# Patient Record
Sex: Female | Born: 1960 | Race: Black or African American | Hispanic: No | Marital: Married | Smoking: Never smoker
Health system: Southern US, Community
[De-identification: ages and names within clinical notes are randomized; demographics above are authoritative.]

## PROBLEM LIST (undated history)

## (undated) DIAGNOSIS — E785 Hyperlipidemia, unspecified: Secondary | ICD-10-CM

## (undated) DIAGNOSIS — N189 Chronic kidney disease, unspecified: Secondary | ICD-10-CM

## (undated) DIAGNOSIS — I10 Essential (primary) hypertension: Secondary | ICD-10-CM

## (undated) DIAGNOSIS — F419 Anxiety disorder, unspecified: Secondary | ICD-10-CM

## (undated) DIAGNOSIS — R569 Unspecified convulsions: Secondary | ICD-10-CM

## (undated) HISTORY — PX: WRIST FRACTURE SURGERY: SHX121

## (undated) HISTORY — PX: ABDOMINAL HYSTERECTOMY: SHX81

---

## 2000-01-05 ENCOUNTER — Encounter: Payer: Self-pay | Admitting: Physical Medicine and Rehabilitation

## 2000-01-05 ENCOUNTER — Ambulatory Visit (HOSPITAL_COMMUNITY)
Admission: RE | Admit: 2000-01-05 | Discharge: 2000-01-05 | Payer: Self-pay | Admitting: Physical Medicine and Rehabilitation

## 2000-01-19 ENCOUNTER — Encounter: Payer: Self-pay | Admitting: Physical Medicine and Rehabilitation

## 2000-01-19 ENCOUNTER — Ambulatory Visit (HOSPITAL_COMMUNITY)
Admission: RE | Admit: 2000-01-19 | Discharge: 2000-01-19 | Payer: Self-pay | Admitting: Physical Medicine and Rehabilitation

## 2000-02-22 ENCOUNTER — Ambulatory Visit (HOSPITAL_BASED_OUTPATIENT_CLINIC_OR_DEPARTMENT_OTHER): Admission: RE | Admit: 2000-02-22 | Discharge: 2000-02-22 | Payer: Self-pay | Admitting: Orthopedic Surgery

## 2000-02-24 ENCOUNTER — Encounter: Payer: Self-pay | Admitting: Orthopedic Surgery

## 2000-02-24 ENCOUNTER — Ambulatory Visit (HOSPITAL_BASED_OUTPATIENT_CLINIC_OR_DEPARTMENT_OTHER): Admission: RE | Admit: 2000-02-24 | Discharge: 2000-02-24 | Payer: Self-pay | Admitting: Orthopedic Surgery

## 2000-05-17 ENCOUNTER — Ambulatory Visit (HOSPITAL_BASED_OUTPATIENT_CLINIC_OR_DEPARTMENT_OTHER): Admission: RE | Admit: 2000-05-17 | Discharge: 2000-05-17 | Payer: Self-pay | Admitting: Orthopedic Surgery

## 2001-01-30 ENCOUNTER — Ambulatory Visit (HOSPITAL_COMMUNITY): Admission: RE | Admit: 2001-01-30 | Discharge: 2001-01-30 | Payer: Self-pay | Admitting: Internal Medicine

## 2001-01-30 ENCOUNTER — Encounter: Payer: Self-pay | Admitting: Internal Medicine

## 2002-01-08 ENCOUNTER — Ambulatory Visit (HOSPITAL_COMMUNITY): Admission: RE | Admit: 2002-01-08 | Discharge: 2002-01-08 | Payer: Self-pay | Admitting: Family Medicine

## 2002-01-08 ENCOUNTER — Encounter: Payer: Self-pay | Admitting: Family Medicine

## 2003-01-23 ENCOUNTER — Ambulatory Visit (HOSPITAL_COMMUNITY): Admission: RE | Admit: 2003-01-23 | Discharge: 2003-01-23 | Payer: Self-pay | Admitting: Internal Medicine

## 2003-01-23 ENCOUNTER — Encounter: Payer: Self-pay | Admitting: Internal Medicine

## 2003-05-29 ENCOUNTER — Ambulatory Visit (HOSPITAL_COMMUNITY): Admission: RE | Admit: 2003-05-29 | Discharge: 2003-05-29 | Payer: Self-pay | Admitting: Family Medicine

## 2003-05-29 ENCOUNTER — Encounter: Payer: Self-pay | Admitting: Family Medicine

## 2003-08-10 ENCOUNTER — Emergency Department (HOSPITAL_COMMUNITY): Admission: EM | Admit: 2003-08-10 | Discharge: 2003-08-10 | Payer: Self-pay | Admitting: *Deleted

## 2003-08-11 ENCOUNTER — Ambulatory Visit (HOSPITAL_COMMUNITY): Admission: RE | Admit: 2003-08-11 | Discharge: 2003-08-11 | Payer: Self-pay | Admitting: Internal Medicine

## 2003-09-01 ENCOUNTER — Emergency Department (HOSPITAL_COMMUNITY): Admission: EM | Admit: 2003-09-01 | Discharge: 2003-09-01 | Payer: Self-pay | Admitting: Emergency Medicine

## 2005-03-02 ENCOUNTER — Emergency Department (HOSPITAL_COMMUNITY): Admission: EM | Admit: 2005-03-02 | Discharge: 2005-03-03 | Payer: Self-pay | Admitting: *Deleted

## 2005-12-12 ENCOUNTER — Ambulatory Visit (HOSPITAL_COMMUNITY): Admission: RE | Admit: 2005-12-12 | Discharge: 2005-12-12 | Payer: Self-pay | Admitting: Family Medicine

## 2006-07-20 ENCOUNTER — Ambulatory Visit (HOSPITAL_COMMUNITY): Admission: RE | Admit: 2006-07-20 | Discharge: 2006-07-20 | Payer: Self-pay | Admitting: Family Medicine

## 2006-10-08 ENCOUNTER — Emergency Department (HOSPITAL_COMMUNITY): Admission: EM | Admit: 2006-10-08 | Discharge: 2006-10-09 | Payer: Self-pay | Admitting: Emergency Medicine

## 2007-10-11 HISTORY — PX: COLONOSCOPY: SHX174

## 2007-10-11 HISTORY — PX: ESOPHAGOGASTRODUODENOSCOPY: SHX1529

## 2008-02-11 ENCOUNTER — Emergency Department (HOSPITAL_COMMUNITY): Admission: EM | Admit: 2008-02-11 | Discharge: 2008-02-11 | Payer: Self-pay | Admitting: Emergency Medicine

## 2008-02-12 ENCOUNTER — Ambulatory Visit (HOSPITAL_COMMUNITY): Admission: RE | Admit: 2008-02-12 | Discharge: 2008-02-12 | Payer: Self-pay | Admitting: Family Medicine

## 2008-02-15 ENCOUNTER — Ambulatory Visit (HOSPITAL_COMMUNITY): Admission: RE | Admit: 2008-02-15 | Discharge: 2008-02-15 | Payer: Self-pay | Admitting: Internal Medicine

## 2008-02-28 ENCOUNTER — Ambulatory Visit: Payer: Self-pay | Admitting: Internal Medicine

## 2008-03-07 ENCOUNTER — Ambulatory Visit (HOSPITAL_COMMUNITY): Admission: RE | Admit: 2008-03-07 | Discharge: 2008-03-07 | Payer: Self-pay | Admitting: Internal Medicine

## 2008-04-22 ENCOUNTER — Ambulatory Visit: Payer: Self-pay | Admitting: Internal Medicine

## 2008-04-22 ENCOUNTER — Ambulatory Visit (HOSPITAL_COMMUNITY): Admission: RE | Admit: 2008-04-22 | Discharge: 2008-04-22 | Payer: Self-pay | Admitting: Internal Medicine

## 2008-08-08 ENCOUNTER — Ambulatory Visit: Payer: Self-pay | Admitting: Internal Medicine

## 2008-09-11 ENCOUNTER — Ambulatory Visit: Payer: Self-pay | Admitting: Internal Medicine

## 2008-09-29 ENCOUNTER — Encounter (HOSPITAL_COMMUNITY): Admission: RE | Admit: 2008-09-29 | Discharge: 2008-10-07 | Payer: Self-pay | Admitting: Internal Medicine

## 2009-01-23 ENCOUNTER — Encounter (INDEPENDENT_AMBULATORY_CARE_PROVIDER_SITE_OTHER): Payer: Self-pay | Admitting: *Deleted

## 2011-02-22 NOTE — Consult Note (Signed)
Tammy Farrell, Tammy Farrell                ACCOUNT NO.:  [id number]   MEDICAL RECORD NO.:  [id number]          PATIENT TYPE:  AMB   LOCATION:  DAY                           FACILITY:  [facility]   PHYSICIAN:  R. Roetta Sessions, M.D. DATE OF BIRTH:  [birth year]   DATE OF CONSULTATION:  DATE OF DISCHARGE:                                 CONSULTATION   REQUESTING PHYSICIAN:  Patrica Duel, MD.   REASON FOR CONSULTATION:  Abdominal pain.   HISTORY OF PRESENT ILLNESS:  Ms. Hillmer is a 50 year old African American  female. A couple of weeks ago, she developed severe upper abdominal pain  around 11 o'clock in the evening.  She had had some bloating that day;  however, at 11:00 p.m., the pain became so severe in her epigastrium and  upper abdomen that she felt like she could not walk, she went to the  emergency room.  The pain was 10/10 on pain scale.  The pain lasted for  about 24 hours or so, severe, and then it did seem to taper off over the  next couple of days.  She was seen by Dr. Nobie Putnam, and an abdominal  ultrasound was ordered which showed multiple cysts in the liver, CBD  mildly dilated at 6.3-mm, and several nonmobile filling defects that  appeared to be adherent to the gallbladder wall, likely representing  polyps.  There was no wall thickening or pericholecystic fluid.  She  does have polycystic kidneys seen on ultrasound as well.  She had an  upper GI series at the same time, which showed moderate-grade GERD.  She  had lab work, which showed normal LFTs, normal lipase, a creatinine of  1.37, a glucose of 107, otherwise normal CMP, a normal CBC, and cardiac  markers which were negative.   She has had no nausea or vomiting since this episode, but during the  episode she did have severe nausea and dry heaves.  She rarely had  heartburn and indigestion prior to this episode, maybe once per month.  She denies any anorexia or early satiety.  She does feel as though food  gets stuck and points to  her xiphoid process.  Denies any odynophagia.  Her weight is steadily increasing.  She does have a bowel movement once  or twice per day.  Denies any rectal bleeding, melena, clay-colored  stools, or jaundice.  She was found to be H. Pylori. She did complete  her Prevpac treatment.  She was previously treated by Dr. Karilyn Cota back in  2000 as well.   PAST MEDICAL AND SURGICAL HISTORY:  1. Hypertension.  2. Hyperlipidemia.  3. Asthma.  4. Polycystic kidney disease , last creatinine 1.37.  5. Recent UTI.  6. Partial hysterectomy followed by bilateral salpingo-oophorectomy.   CURRENT MEDICATIONS:  1. Prevpac will be completed on Mar 02, 2008.  2. Macrobid 100 mg b.i.d.  3. Zocor 40 mg nightly.  4. Norvasc 10 mg daily.  5. Clonidine 0.1 mg t.i.d.  6. Advair Diskus p.r.n.  7. Albuterol inhaler p.r.n.   ALLERGIES:  No known drug allergies.   FAMILY HISTORY:  There is no known family history of colorectal  carcinoma, inflammatory bowel disease or chronic GI problems.   SOCIAL HISTORY:  Ms. Jenean Lindau is married.  She has two healthy children.  She is employed with the [employer] and works in  [employer].  She denies any tobacco, alcohol or drug use.   REVIEW OF SYSTEMS:  See HPI.  GU:  She had a recent urinary tract  infection, and is on Macrobid.  Denies any problems at this time.  Otherwise, negative review of systems. See HPI.   PHYSICAL EXAMINATION:  VITAL SIGNS:  Weight 246 pounds, height 6 feet 5-  inches, temperature 98 degrees, blood pressure 138/100, and pulse 80.  GENERAL:  Ms. Jared is a 50 year old female, who is alert, oriented,  pleasant, cooperative, and in no acute distress.  HEENT:  __________ clear, sclerae nonicteric. Conjunctivae pink.  Oropharynx is pink and moist without any lesions.  NECK:  Supple without any mass or thyromegaly.  CHEST:  Regular rate and rhythm.  Normal S1 and S2 without any murmurs,  clicks, rubs or gallops.  LUNGS:   Clear to auscultation bilaterally.  ABDOMEN:  Protuberant with positive bowel sounds x4.  No bruits  auscultated.  Soft and nondistended.  She does have mild tenderness to  the epigastrium and Murphy point tenderness.  There is no rebound  tenderness or guarding.  No hepatosplenomegaly or mass.  Exam is limited  given the patient's body habitus.  EXTREMITIES:  Without clubbing or edema bilaterally.   IMPRESSION:  Ms. Quito is a 50 year old African American female with  acute onset of severe epigastric and upper abdominal pain with nausea  and dry heaves, which led to an emergency room visit, and ultrasound and  upper GI series a couple of days later.  She does have a mildly dilated  common bile duct and gallbladder that appears to have polyp.  I would  question whether she has passed a gallstone despite the fact that her  LFTs were normal.  There was no evidence of pancreatitis.   She does have solid food dysphagia that appears to point to her distal  esophagus, which may require further evaluation.  However on upper GI  series, there is no evidence of webbing or stricture or mass noted.   Finally, she is due for average-risk screening colonoscopy given ACG  guidelines for African Americans.   PLAN:  1. She can complete Prevpac.  2. Start omeprazole 20 mg q.a.m. #31 with two refills.  3. I have discussed this case with Dr. Jena Gauss regarding her CBD      dilatation, and will pursue MRCP and MRI of the pancreas to further      evaluate her common bile duct and look for choledocholithiasis.  4. We will need to check her creatinine prior to MRCP.  If it is      elevated, the exam may need to be performed without a contrast.  5. Screening colonoscopy and EGD with possible esophageal dilatation      after MRCP, if MRCP is normal.  6. She is to follow up with Dr. Nobie Putnam regarding her hypertension.    Thank you Dr. Nobie Putnam for allowing [place] to participate in the care of Ms.   Carolin Coy.        Lorenza Burton, N.P.      Jonathon Bellows, M.D.  Electronically Signed    KJ/MEDQ  D:  02/28/2008  T:  02/29/2008  Job:  [id number]

## 2011-02-22 NOTE — Op Note (Signed)
Tammy Farrell, Tammy Farrell                ACCOUNT NO.:  [id number]   MEDICAL RECORD NO.:  [id number]          PATIENT TYPE:  AMB   LOCATION:  Tammy Farrell                           FACILITY:  [facility]   PHYSICIAN:  R. Roetta Sessions, M.D. DATE OF BIRTH:  1961/01/31   DATE OF PROCEDURE:  DATE OF DISCHARGE:                               OPERATIVE REPORT   Diagnostic EGD followed by screening colonoscopy.   INDICATIONS FOR PROCEDURE:  A 50 year old lady with acute onset  epigastric bilateral upper quadrant abdominal pain, nausea, and vomiting  necessitating ED visit.  Ultrasound upper GI series demonstrated bile  duct 6- to 7-mm questionable gallbladder polyps.  LFTs, amylase, and  lipase came back normal.  Common bile duct was 6-7 mm in diameter.  MRCP  demonstrated multiple cysts in the liver and kidney suggestive of  polycystic liver disease.  There was no evidence of stone disease on  MRI.  She was started on omeprazole 20 mg orally daily when we saw her  in the office recently.  This has been associated with a resolution of  the above-mentioned symptoms.  She has not had any abdominal pain or  reflux symptoms.  In fact, she was also complaining of dysphagia to  solids; however, those symptoms as well have resolved.  Upper GI series  suggested for gastroesophageal reflux disease.  EGD and colonoscopy are  now being done (latter done primarily for screening).  Risks, benefits,  alternatives, and limitations have been reviewed, questions answered.  She is agreeable.  Please see the documentation in medical record.   PROCEDURE NOTE:  O2 saturation, blood pressure, pulse, and respirations  monitored throughout the entire procedure.   CONSCIOUS SEDATION:  Versed 7 mg IV and Demerol 125 mg IV in divided  doses.   INSTRUMENT:  Pentax video chip system.   Cetacaine spray for topical pharyngeal anesthesia.   FINDINGS:  EGD examination of the tubular esophagus revealed normal-  appearing mucosa.   Esophageal lumen was widely patent through the EG  junction.   STOMACH:  Gastric cavity was emptied, insufflated well with air.  Thorough examination of the gastric mucosa including retroflexed view of  the proximal stomach esophagogastric junction demonstrated only a small  hiatal hernia.  Pylorus is patent, easily traversed.  Examination of the  bulb and second portion revealed no abnormalities.   THERAPEUTIC/DIAGNOSTIC MANEUVERS PERFORMED:  None.   The patient tolerated the procedure well and was prepared for  colonoscopy.  Digital rectal exam revealed no abnormalities.  Endoscopic  findings:  The prep was very poor.  Colon:  Colonic mucosa was surveyed  from the rectosigmoid junction through the left transverse, the right  colon, appendiceal orifice, ileocecal valve, and cecum.  The patient a  long capacious colon.  There was thick viscous liquid and semi formed  stool throughout the colon, which precluded complete examination of all  the colonic mucosa.  Because of the elongated redundant nature of the  colon, it was difficult to get a deep cecal intubation (this was  ultimately not feasible) from the level of the ileocecal valve and  the  cecum.  The scope was slowly withdrawn.  All previous mentioned mucosal  surfaces were again seen.  Because of the looping and redundancy,  changing the patient's position and external abdominal pressure were  required to maneuver the scope into the area of the ileocecal valve and  cecum; however, deep cecal intubation was not obtained.  The prep was  poor diffusely and as stated the colonic mucosa was not seen in its  entirety.  The scope was withdrawn, and all previously mentioned mucosal  segments were again seen.  Scope was pulled down the rectum.  A thorough  examination of the rectal mucosa including retroflexed view of the anal  verge demonstrated no abnormalities.  The patient tolerated both  procedures well, was reacted to endoscopy.    IMPRESSION:  EGD normal esophagus, small hiatal hernia, otherwise normal  stomach, D1-D2 colonoscopy findings, poor prep precluded complete  examination as described above, long redundant colon, and normal rectum.   The patient's polycystic kidney disease, elevated creatinine, and  elevated blood pressure today.   RECOMMENDATIONS:  1. Recommended Tammy Farrell follow up with Dr. Nobie Putnam regarding      management of chronic kidney disease and hypertension.  2. Her recent GI evaluation have resolved on omeprazole.  I would      continue to be suspicious that she did have a biliary etiology for      least some of her symptoms.  She has gallbladder polyps based on      ultrasound.  No evidence of choledocholithiasis on MRI/  Further      recommendations would include continue omeprazole 20 mg every Tammy Farrell.  3. We will arrange follow up appointment with [place] in 3 months.  4. At a minimum, I would suggest a repeat ultrasound besides potential      gallbladder polyps in 1 year as long as the patient does not have      any interim symptoms referable to the biliary tree, I would not      advocate cholecystectomy at this time.  5. Would bring her back for an early repeat screening colonoscopy in      the presence of a double prep (i.e. 5 years from there).      Jonathon Bellows, M.D.  Electronically Signed     RMR/MEDQ  D:  04/22/2008  T:  04/22/2008  Job:  [id number]   cc:   Patrica Duel, M.D.  Fax: [phone]

## 2011-02-22 NOTE — Assessment & Plan Note (Signed)
Tammy Farrell, BRITTLE                 CHART#:  [id number]   DATE:  09/11/2008                       DOB:  Mar 07, 1961   FOLLOWUP:  Band-like intermittent upper abdominal pain.   The patient returns after last being seen on August 08, 2008.  She has  had off and on band-like upper abdominal/epigastric pain, predominantly  postprandial intermittently for actually years.  We discussed the onset  of her symptoms.  She actually has had symptoms going back to around  2000 with Dr. Nadine Counts L. Ferguson and Dr. Karilyn Cota.  She does have gallbladder  polyp.  No stones.  She recently had an EGD and colonoscopy, which  failed to demonstrate the cause for symptoms.  She has failed to respond  to antacids and proton pump inhibitor therapy and closely reviewing her  symptoms.  She does describe a retroxiphoid pain, not like pain within  an hour after eating a meal.  She fasts.  She really does not have any  symptoms.  She does not have any melena, rectal bleeding, or otherwise  change in bowel habits.  She does have polycystic kidney disease and  noted to have a recent elevated creatinine.  Previously recommended she  follow up with Dr. Nobie Putnam and while on that and she has not yet  followed up with him because cause of a marginal prep on prior  colonoscopy.  It was recommended she come back in 5 years for a repeat  examination.  There was some question of biliary dilation on prior  ultrasound.  MRCP demonstrated again cyst in liver and kidney.  There is  no evidence of common duct stone disease or anything producing  mechanical obstruction.  Upper GI series demonstrated some suggestion of  gastroesophageal reflux disease, but again she has really had a poor  response to acid suppression/antacid therapy.  She has never had a HIDA  scan.   CURRENT MEDICATIONS:  See updated list.  She is taking Kapidex 60 mg  orally daily.   ALLERGIES:  No known drug allergies.   FAMILY HISTORY:  There is no family  history of GI malignancy including  gallbladder cancer.   PHYSICAL EXAMINATION:  GENERAL:  Today, she appears in no acute  distress.  VITAL SIGNS:  Weight 255, height 5 feet 5 inches, temperature 98.0, BP  130/100, and pulse 68.  SKIN:  Warm and dry.  CHEST:  Lungs are clear to auscultation.  CARDIAC:  Regular rate and rhythm without murmur, gallop, or rub.  ABDOMEN:  Obese, positive bowel sounds, soft and nontender without  appreciable mass or hepatosplenomegaly.   ASSESSMENT:  Longstanding intermittent waxing and waning, postprandial  epigastric pain, history of gallbladder polyp on ultrasound, borderline  dilated bile duct not mentioned above.  She has had normal LFTs.   At this point, I am fairly suspicious her gallbladder is the etiology of  her symptoms.   RECOMMENDATIONS:  We will proceed with a HIDA with fatty meal challenge  in the very near future.  She has evidence of chronic  cholecystitis/biliary dyskinesia and I told the patient, we would  arrange for her to see a surgeon to get her gallbladder out.  Again, I  have advised her given her elevated creatinine and polycystic kidney  disease, she ought to follow up with Dr. Nobie Putnam in  the near future.  Further recommendations to follow.       Jonathon Bellows, M.D.  Electronically Signed     RMR/MEDQ  D:  09/11/2008  T:  09/11/2008  Job:  [id number]

## 2011-02-22 NOTE — Assessment & Plan Note (Signed)
NAMEMarland Kitchen  ZOEE, HEENEY                 CHART#:  [id number]   DATE:  08/08/2008                       DOB:  [birth year]   Last seen on April 22, 2008, at which time she underwent EGD and a  screening colonoscopy.  She was found to have small hiatal hernia,  otherwise upper GI tract appeared unremarkable.  Poor prep screening  colonoscopy demonstrated no obvious abnormalities.  Recommended she come  back (with double prep) in 5 years because of the relatively poor prep.  She was having some upper abdominal pain which she relates to reflux.  She did noted that she did better with omeprazole, but has stopped  taking that agent and has had recurrent band-like upper abdominal pain,  nonassertive, but is associated with eating, may come and go over 1-2  hours, off and on may have more than one attack in a given day,  sometimes awaken from her sound sleep.  She has small gallbladder  polyps.  On ultrasound notes choledocholithiasis.  LFTs have been  completely normal.  She has slightly elevated creatinine at 1.40 and we  previously recommended that she see Dr. Nobie Putnam.  She has not yet  followed up with him.  No melena.  No hematochezia.  No early satiety.   CURRENT MEDICATIONS:  See updated list.   ALLERGIES:  No known drug allergies.   PHYSICAL EXAMINATION:  GENERAL:  She appears in no acute distress.  VITAL SIGNS:  Weight 254, height 5 feet and 5 inches, temperature 98, BP  130/90, and pulse 64.  SKIN:  Warm and dry.  LUNGS:  Clear to auscultation.  CARDIAC:  Regular rate and rhythm without murmur, gallop, or rub.  ABDOMEN:  Nondistended, obese, positive bowel sounds, soft, nontender.  No appreciable mass or organomegaly.   ASSESSMENT:  Band-like upper abdominal pain waxes and wanes, this may be  equivalent gastroesophageal reflux disease symptoms, atypical  gallbladder disease.  She does have gallbladder polyps; not mentioned  above, dosing with Rolaids definitely knocks down her pain, a  good 90%,  but only transiently.  I suspect this may in fact be somewhat atypical  gastroesophageal reflux disease symptoms.   RECOMMENDATIONS:  1. A 68-month course of Kapidex 60 mg orally daily.  We will see her      back in 1 month and assess her progress.  If she is not      dramatically better, we would proceed with a HIDA with fatty meal      challenge.  At a minimum, she will get another ultrasound in May or      June of next year to size of the gallbladder polyps once again.  2. Polycystic kidney disease.  Elevated creatinine, again she was      advised to follow up Dr. Nobie Putnam as I will defer management to      him.  3. Marginal prep recent screening colonoscopy has recommended early      followup screening in 5 years.       Jonathon Bellows, M.D.  Electronically Signed     RMR/MEDQ  D:  08/08/2008  T:  08/09/2008  Job:  [id number]   cc:   Patrica Duel, M.D.

## 2011-02-25 NOTE — Consult Note (Signed)
[place]  Patient:    Tammy Farrell                         MRN: [id number] Proc. Date: 02/24/00 Adm. Date:  [id number] Disc. Date: [id number] Attending:  Ronne Binning Dictator:   [id number] CC:         Dr. Cindee Salt             Dr. Audie Pinto, [place]             Dr. Mervyn Skeeters                          Consultation Report  CHIEF COMPLAINT: Seizure and headache.  HISTORY OF PRESENT ILLNESS: Tammy Farrell is a 50 year old right-handed married female who came in to the day surgery today for wrist arthroscopy.  While undergoing a regional anesthesia by axillary block, she became unconscious and had seizure activity.  She was intubated and stabilized, and shortly afterward was able to be extubated.  She has complained of a headache since that time localized to the right frontal region, which is throbbing in nature.  The headache has apparently improved over a period of observation but has not resolved completely.  The patient denies prior history of seizures.  She has had headaches in the past, usually related to anxiety, and underwent a CT scan of the brain for that in the past year or so, which was negative.  PAST MEDICAL HISTORY:  1. Diagnosis of polycystic kidney disease, but apparently she maintains good     renal function.  2. History of hypertension.  3. Status post hysterectomy.  CURRENT MEDICATIONS:  1. Plendil.  2. Ziac.  3. Lipitor.  4. Xanax or Ativan p.r.n.  FAMILY HISTORY: The patients father has a history of strokes and hypertension.  SOCIAL HISTORY: The patient is married and has two children.  PHYSICAL EXAMINATION:  GENERAL: This patient is a well-developed, obese, alert lady in mild distress from headache.  VITAL SIGNS: Blood pressure 142/90, pulse 81 and regular, respirations 14.  HEENT: Cranium normocephalic, atraumatic.  NECK: Supple without bruits.  HEART: Regular rate and rhythm without  murmurs.  LUNGS: Clear to auscultation.  EXTREMITIES: Without clubbing, cyanosis, or edema.  NEUROLOGIC: The patient is alert and oriented.  Her speech is normal.  Memory is intact.  Cranial nerve examination reveals full extraocular movements without nystagmus.  Pupils are equal and reactive to light, and visual fields are full to confrontation.  Funduscopic examination is unremarkable.  Facies symmetric.  The tongue protrudes in the midline.  Motor testing reveals negative RA, BA, RRE.  There is 5/5 strength throughout the upper and lower extremities.  Deep tendon reflexes are 1-2+ and symmetric, and plantar responses are downgoing bilaterally.  Sensory examination is intact to pinprick, temperature, and vibratory sensation except for decreased pinprick in the left arm, which is the site of her regional anesthesia.  Cerebellar testing reveals good rapid alternating movements and finger-to-nose.  Gait is normal based.  IMPRESSION:  1. Seizure, apparently secondary to systemic infiltration of local     anesthetic.  2. Headache, probably related to seizure and the medication she received     at the time of intubation, etc.  Examination reveals no focal neurologic     deficits.  RECOMMENDATIONS: I feel that she should undergo an outpatient EEG this coming week to rule out an underlying tendency  toward seizures.  I do not feel that she requires brain imaging study at this time, although certainly should her headache persist or if she developed other problems, I would have a little threshold to obtain a CT of the brain.  I appreciate the opportunity of evaluating this interesting patient. DD:  02/24/00 TD:  02/24/00 Job: [id number]

## 2011-07-13 ENCOUNTER — Other Ambulatory Visit (HOSPITAL_COMMUNITY): Payer: Self-pay | Admitting: Nephrology

## 2011-07-13 DIAGNOSIS — N289 Disorder of kidney and ureter, unspecified: Secondary | ICD-10-CM

## 2011-07-18 ENCOUNTER — Ambulatory Visit (HOSPITAL_COMMUNITY)
Admission: RE | Admit: 2011-07-18 | Discharge: 2011-07-18 | Disposition: A | Discharge status: Home / Self Care | Payer: BC Managed Care – PPO | Source: Ambulatory Visit | Attending: Nephrology | Admitting: Nephrology

## 2011-07-18 DIAGNOSIS — N289 Disorder of kidney and ureter, unspecified: Secondary | ICD-10-CM

## 2011-07-18 DIAGNOSIS — Q619 Cystic kidney disease, unspecified: Secondary | ICD-10-CM | POA: Insufficient documentation

## 2011-08-18 ENCOUNTER — Emergency Department (HOSPITAL_COMMUNITY): Payer: BC Managed Care – PPO

## 2011-08-18 ENCOUNTER — Observation Stay (HOSPITAL_COMMUNITY)
Admission: EM | Admit: 2011-08-18 | Discharge: 2011-08-20 | DRG: 219 | Disposition: A | Discharge status: Home Health | Payer: BC Managed Care – PPO | Attending: Orthopaedic Surgery | Admitting: Orthopaedic Surgery

## 2011-08-18 ENCOUNTER — Encounter (HOSPITAL_COMMUNITY)
Admission: EM | Disposition: A | Discharge status: Home Health | Payer: Self-pay | Source: Home / Self Care | Attending: Emergency Medicine

## 2011-08-18 ENCOUNTER — Emergency Department (HOSPITAL_COMMUNITY): Payer: BC Managed Care – PPO | Admitting: Anesthesiology

## 2011-08-18 ENCOUNTER — Encounter: Payer: Self-pay | Admitting: Emergency Medicine

## 2011-08-18 ENCOUNTER — Encounter (HOSPITAL_COMMUNITY): Payer: Self-pay | Admitting: Anesthesiology

## 2011-08-18 DIAGNOSIS — Z79899 Other long term (current) drug therapy: Secondary | ICD-10-CM

## 2011-08-18 DIAGNOSIS — I10 Essential (primary) hypertension: Secondary | ICD-10-CM | POA: Diagnosis present

## 2011-08-18 DIAGNOSIS — X500XXA Overexertion from strenuous movement or load, initial encounter: Secondary | ICD-10-CM | POA: Diagnosis present

## 2011-08-18 DIAGNOSIS — S82409A Unspecified fracture of shaft of unspecified fibula, initial encounter for closed fracture: Secondary | ICD-10-CM

## 2011-08-18 DIAGNOSIS — S9306XA Dislocation of unspecified ankle joint, initial encounter: Secondary | ICD-10-CM | POA: Diagnosis present

## 2011-08-18 DIAGNOSIS — S82843A Displaced bimalleolar fracture of unspecified lower leg, initial encounter for closed fracture: Principal | ICD-10-CM | POA: Diagnosis present

## 2011-08-18 DIAGNOSIS — S82209A Unspecified fracture of shaft of unspecified tibia, initial encounter for closed fracture: Secondary | ICD-10-CM

## 2011-08-18 DIAGNOSIS — W19XXXA Unspecified fall, initial encounter: Secondary | ICD-10-CM | POA: Diagnosis present

## 2011-08-18 HISTORY — PX: ORIF ANKLE FRACTURE: SHX5408

## 2011-08-18 HISTORY — DX: Essential (primary) hypertension: I10

## 2011-08-18 LAB — DIFFERENTIAL
Basophils Relative: 0 % (ref 0–1)
Eosinophils Absolute: 0.1 10*3/uL (ref 0.0–0.7)
Lymphs Abs: 3.3 10*3/uL (ref 0.7–4.0)
Monocytes Relative: 7 % (ref 3–12)
Neutro Abs: 4.5 10*3/uL (ref 1.7–7.7)
Neutrophils Relative %: 53 % (ref 43–77)

## 2011-08-18 LAB — BASIC METABOLIC PANEL
Chloride: 105 mEq/L (ref 96–112)
GFR calc Af Amer: 31 mL/min — ABNORMAL LOW (ref >90)
GFR calc non Af Amer: 27 mL/min — ABNORMAL LOW (ref >90)
Potassium: 3.9 mEq/L (ref 3.5–5.1)
Sodium: 140 mEq/L (ref 135–145)

## 2011-08-18 LAB — CBC
Hemoglobin: 14.3 g/dL (ref 12.0–15.0)
MCH: 28.4 pg (ref 26.0–34.0)
Platelets: 283 10*3/uL (ref 150–400)
RBC: 5.04 MIL/uL (ref 3.87–5.11)

## 2011-08-18 SURGERY — OPEN REDUCTION INTERNAL FIXATION (ORIF) ANKLE FRACTURE
Anesthesia: General | Site: Ankle | Laterality: Left | Wound class: Clean

## 2011-08-18 MED ORDER — DIPHENHYDRAMINE HCL 50 MG/ML IJ SOLN: 12.5000 mg | Freq: Four times a day (QID) | INTRAMUSCULAR | Status: DC | PRN

## 2011-08-18 MED ORDER — ALBUTEROL SULFATE (5 MG/ML) 0.5% IN NEBU
2.5000 mg | INHALATION_SOLUTION | Freq: Two times a day (BID) | RESPIRATORY_TRACT | Status: DC
  Administered 2011-08-19 – 2011-08-20 (×3): 2.5 mg via RESPIRATORY_TRACT
  Filled 2011-08-18 (×3): qty 0.5

## 2011-08-18 MED ORDER — METOPROLOL TARTRATE 50 MG PO TABS
50.0000 mg | ORAL_TABLET | Freq: Two times a day (BID) | ORAL | Status: DC
  Administered 2011-08-18 – 2011-08-20 (×4): 50 mg via ORAL
  Filled 2011-08-18 (×3): qty 1

## 2011-08-18 MED ORDER — METRONIDAZOLE IN NACL 5-0.79 MG/ML-% IV SOLN
INTRAVENOUS | Status: AC
  Filled 2011-08-18: qty 100

## 2011-08-18 MED ORDER — DIPHENHYDRAMINE HCL 12.5 MG/5ML PO ELIX: 12.5000 mg | ORAL_SOLUTION | Freq: Four times a day (QID) | ORAL | Status: DC | PRN

## 2011-08-18 MED ORDER — ZOLPIDEM TARTRATE 5 MG PO TABS: 10.0000 mg | ORAL_TABLET | Freq: Every evening | ORAL | Status: DC | PRN

## 2011-08-18 MED ORDER — FUROSEMIDE 20 MG PO TABS
20.0000 mg | ORAL_TABLET | Freq: Two times a day (BID) | ORAL | Status: DC
  Administered 2011-08-19 – 2011-08-20 (×3): 20 mg via ORAL
  Filled 2011-08-18 (×3): qty 1

## 2011-08-18 MED ORDER — MAGNESIUM HYDROXIDE 400 MG/5ML PO SUSP: 30.0000 mL | Freq: Every day | ORAL | Status: DC | PRN

## 2011-08-18 MED ORDER — CEFAZOLIN SODIUM 1-5 GM-% IV SOLN
INTRAVENOUS | Status: DC | PRN
  Administered 2011-08-18: 1 g via INTRAVENOUS

## 2011-08-18 MED ORDER — LORATADINE 10 MG PO TABS
10.0000 mg | ORAL_TABLET | Freq: Every day | ORAL | Status: DC
  Administered 2011-08-19 – 2011-08-20 (×2): 10 mg via ORAL
  Filled 2011-08-18 (×2): qty 1

## 2011-08-18 MED ORDER — PROPOFOL 10 MG/ML IV EMUL
INTRAVENOUS | Status: DC | PRN
  Administered 2011-08-18: 160 mg via INTRAVENOUS

## 2011-08-18 MED ORDER — ALBUTEROL SULFATE (5 MG/ML) 0.5% IN NEBU: 2.5000 mg | INHALATION_SOLUTION | Freq: Four times a day (QID) | RESPIRATORY_TRACT | Status: DC

## 2011-08-18 MED ORDER — CEFAZOLIN SODIUM 1-5 GM-% IV SOLN
1.0000 g | INTRAVENOUS | Status: DC
  Filled 2011-08-18: qty 50

## 2011-08-18 MED ORDER — ONDANSETRON HCL 4 MG/2ML IJ SOLN
INTRAMUSCULAR | Status: DC | PRN
  Administered 2011-08-18: 4 mg via INTRAVENOUS

## 2011-08-18 MED ORDER — LIDOCAINE HCL 1 % IJ SOLN
INTRAMUSCULAR | Status: DC | PRN
  Administered 2011-08-18: 30 mg via INTRADERMAL

## 2011-08-18 MED ORDER — MORPHINE SULFATE (PF) 1 MG/ML IV SOLN
INTRAVENOUS | Status: DC
  Administered 2011-08-18: 25 mg via INTRAVENOUS
  Administered 2011-08-19: 1.2 mg via INTRAVENOUS
  Administered 2011-08-19 – 2011-08-20 (×3): 3 mg via INTRAVENOUS
  Administered 2011-08-20: 4.5 mg via INTRAVENOUS
  Administered 2011-08-20: 1.5 mg via INTRAVENOUS
  Filled 2011-08-18: qty 25

## 2011-08-18 MED ORDER — ACETAMINOPHEN 325 MG PO TABS: 650.0000 mg | ORAL_TABLET | Freq: Four times a day (QID) | ORAL | Status: DC | PRN

## 2011-08-18 MED ORDER — ENOXAPARIN SODIUM 40 MG/0.4ML ~~LOC~~ SOLN
40.0000 mg | SUBCUTANEOUS | Status: DC
  Administered 2011-08-19 – 2011-08-20 (×2): 40 mg via SUBCUTANEOUS
  Filled 2011-08-18 (×2): qty 0.4

## 2011-08-18 MED ORDER — HYDROMORPHONE HCL PF 1 MG/ML IJ SOLN
1.0000 mg | Freq: Once | INTRAMUSCULAR | Status: AC
  Administered 2011-08-18: 1 mg via INTRAVENOUS
  Filled 2011-08-18: qty 1

## 2011-08-18 MED ORDER — AMLODIPINE BESYLATE 5 MG PO TABS
10.0000 mg | ORAL_TABLET | Freq: Every day | ORAL | Status: DC
  Administered 2011-08-19 – 2011-08-20 (×2): 10 mg via ORAL
  Filled 2011-08-18 (×2): qty 2

## 2011-08-18 MED ORDER — ONDANSETRON HCL 4 MG/2ML IJ SOLN
4.0000 mg | Freq: Once | INTRAMUSCULAR | Status: AC
  Administered 2011-08-18: 4 mg via INTRAVENOUS
  Filled 2011-08-18: qty 2

## 2011-08-18 MED ORDER — CEFAZOLIN SODIUM 1-5 GM-% IV SOLN
INTRAVENOUS | Status: AC
  Filled 2011-08-18: qty 100

## 2011-08-18 MED ORDER — ALBUTEROL SULFATE (5 MG/ML) 0.5% IN NEBU: 2.5000 mg | INHALATION_SOLUTION | RESPIRATORY_TRACT | Status: DC | PRN

## 2011-08-18 MED ORDER — SIMVASTATIN 10 MG PO TABS
10.0000 mg | ORAL_TABLET | Freq: Every day | ORAL | Status: DC
  Administered 2011-08-19: 10 mg via ORAL
  Filled 2011-08-18: qty 1

## 2011-08-18 MED ORDER — ROCURONIUM BROMIDE 100 MG/10ML IV SOLN
INTRAVENOUS | Status: DC | PRN
  Administered 2011-08-18: 35 mg via INTRAVENOUS

## 2011-08-18 MED ORDER — SODIUM CHLORIDE 0.9 % IV SOLN
INTRAVENOUS | Status: DC
  Administered 2011-08-18 – 2011-08-20 (×4): via INTRAVENOUS

## 2011-08-18 MED ORDER — LACTATED RINGERS IV SOLN
INTRAVENOUS | Status: DC | PRN
  Administered 2011-08-18: 20:00:00 via INTRAVENOUS

## 2011-08-18 MED ORDER — ONDANSETRON HCL 4 MG/2ML IJ SOLN: 4.0000 mg | Freq: Four times a day (QID) | INTRAMUSCULAR | Status: DC | PRN

## 2011-08-18 MED ORDER — FENTANYL CITRATE 0.05 MG/ML IJ SOLN
INTRAMUSCULAR | Status: DC | PRN
  Administered 2011-08-18: 50 ug via INTRAVENOUS
  Administered 2011-08-18: 100 ug via INTRAVENOUS
  Administered 2011-08-18 (×4): 50 ug via INTRAVENOUS
  Administered 2011-08-18: 100 ug via INTRAVENOUS

## 2011-08-18 MED ORDER — CEFAZOLIN SODIUM 1-5 GM-% IV SOLN
1.0000 g | Freq: Three times a day (TID) | INTRAVENOUS | Status: AC
  Administered 2011-08-19 (×2): 1 g via INTRAVENOUS
  Filled 2011-08-18 (×2): qty 50

## 2011-08-18 MED ORDER — SUCCINYLCHOLINE CHLORIDE 20 MG/ML IJ SOLN
INTRAMUSCULAR | Status: DC | PRN
  Administered 2011-08-18: 180 mg via INTRAVENOUS

## 2011-08-18 MED ORDER — SODIUM CHLORIDE 0.9 % IR SOLN
Status: DC | PRN
  Administered 2011-08-18: 1000 mL

## 2011-08-18 MED ORDER — SODIUM CHLORIDE 0.9 % IJ SOLN: 9.0000 mL | INTRAMUSCULAR | Status: DC | PRN

## 2011-08-18 MED ORDER — NALOXONE HCL 0.4 MG/ML IJ SOLN: 0.4000 mg | INTRAMUSCULAR | Status: DC | PRN

## 2011-08-18 MED ORDER — PROMETHAZINE HCL 25 MG/ML IJ SOLN: 12.5000 mg | INTRAMUSCULAR | Status: DC | PRN

## 2011-08-18 MED ORDER — MIDAZOLAM HCL 5 MG/5ML IJ SOLN
INTRAMUSCULAR | Status: DC | PRN
  Administered 2011-08-18: 2 mg via INTRAVENOUS

## 2011-08-18 SURGICAL SUPPLY — 60 items
BAG HAMPER (MISCELLANEOUS) ×2 IMPLANT
BANDAGE ELASTIC 4 VELCRO NS (GAUZE/BANDAGES/DRESSINGS) ×4 IMPLANT
BANDAGE ELASTIC 6 VELCRO NS (GAUZE/BANDAGES/DRESSINGS) ×6 IMPLANT
BANDAGE ESMARK 4X12 BL STRL LF (DISPOSABLE) ×1 IMPLANT
BIT DRILL 2.5X110 QC LCP DISP (BIT) ×2 IMPLANT
BIT DRILL 2.8 (BIT) ×1
BIT DRILL CANN QC 2.8X165 (BIT) ×1 IMPLANT
BIT DRILL JC END 3.2X130 (BIT) ×2 IMPLANT
BLADE SURG 15 STRL LF DISP TIS (BLADE) ×1 IMPLANT
BLADE SURG 15 STRL SS (BLADE) ×1
BNDG ESMARK 4X12 BLUE STRL LF (DISPOSABLE) ×2
CLOTH BEACON ORANGE TIMEOUT ST (SAFETY) ×2 IMPLANT
COVER LIGHT HANDLE STERIS (MISCELLANEOUS) ×4 IMPLANT
COVER MAYO STAND XLG (DRAPE) ×2 IMPLANT
CUFF TOURNIQUET SINGLE 34IN LL (TOURNIQUET CUFF) IMPLANT
CUFF TOURNIQUET SINGLE 44IN (TOURNIQUET CUFF) ×2 IMPLANT
DRAPE C-ARM FOLDED MOBILE STRL (DRAPES) ×4 IMPLANT
DRILL BIT 2.8MM (BIT) ×1
GAUZE XEROFORM 5X9 LF (GAUZE/BANDAGES/DRESSINGS) ×2 IMPLANT
GLOVE BIO SURGEON STRL SZ8 (GLOVE) ×2 IMPLANT
GLOVE BIO SURGEON STRL SZ8.5 (GLOVE) ×2 IMPLANT
GLOVE ECLIPSE 6.5 STRL STRAW (GLOVE) ×4 IMPLANT
GLOVE EXAM NITRILE MD LF STRL (GLOVE) ×4 IMPLANT
GLOVE INDICATOR 7.0 STRL GRN (GLOVE) ×4 IMPLANT
GOWN BRE IMP SLV AUR XL STRL (GOWN DISPOSABLE) ×4 IMPLANT
GOWN STRL REIN XL XLG (GOWN DISPOSABLE) ×2 IMPLANT
INST SET MINOR BONE (KITS) ×2 IMPLANT
K-WIRE 229MX1.6 (WIRE) IMPLANT
K-WIRE 4.0X.028 (WIRE) ×2 IMPLANT
K-WIRE DBL PT .062 (WIRE) ×2 IMPLANT
KIT ROOM TURNOVER APOR (KITS) ×2 IMPLANT
MANIFOLD NEPTUNE II (INSTRUMENTS) ×2 IMPLANT
NS IRRIG 1000ML POUR BTL (IV SOLUTION) ×2 IMPLANT
PACK BASIC LIMB (CUSTOM PROCEDURE TRAY) ×2 IMPLANT
PAD ABD 5X9 TENDERSORB (GAUZE/BANDAGES/DRESSINGS) ×6 IMPLANT
PAD ARMBOARD 7.5X6 YLW CONV (MISCELLANEOUS) ×2 IMPLANT
PAD CAST 4YDX4 CTTN HI CHSV (CAST SUPPLIES) ×1 IMPLANT
PADDING CAST COTTON 4X4 STRL (CAST SUPPLIES) ×1
PLATE LCP 3.5 1/3 TUB 8HX93 (Plate) ×2 IMPLANT
SCREW CORTEX 3.5 14MM (Screw) ×3 IMPLANT
SCREW CORTEX 3.5 16MM (Screw) ×2 IMPLANT
SCREW CORTEX 3.5 18MM (Screw) ×1 IMPLANT
SCREW CORTEX 3.5 38MM (Screw) ×1 IMPLANT
SCREW LOCK CORT ST 3.5X14 (Screw) ×3 IMPLANT
SCREW LOCK CORT ST 3.5X16 (Screw) ×2 IMPLANT
SCREW LOCK CORT ST 3.5X18 (Screw) ×1 IMPLANT
SCREW LOCK CORT ST 3.5X38 (Screw) ×1 IMPLANT
SCREW LOCKING 3.5 (Screw) ×2 IMPLANT
SCREW PROS MALLEO 55 26MM (Screw) ×2 IMPLANT
SET BASIN LINEN APH (SET/KITS/TRAYS/PACK) ×2 IMPLANT
SOL PREP PROV IODINE SCRUB 4OZ (MISCELLANEOUS) ×2 IMPLANT
SPLINT J 5 (CAST SUPPLIES) ×2 IMPLANT
SPONGE GAUZE 4X4 12PLY (GAUZE/BANDAGES/DRESSINGS) ×2 IMPLANT
SPONGE LAP 18X18 X RAY DECT (DISPOSABLE) ×2 IMPLANT
SPONGE LAP 4X18 X RAY DECT (DISPOSABLE) IMPLANT
STAPLER VISISTAT 35W (STAPLE) ×2 IMPLANT
SUT CHROMIC 2 0 CT 1 (SUTURE) ×4 IMPLANT
SUT NUROLON CT 2 BLK #1 18IN (SUTURE) IMPLANT
SUT PLAIN 2 0 XLH (SUTURE) ×4 IMPLANT
TOWEL OR 17X26 4PK STRL BLUE (TOWEL DISPOSABLE) ×2 IMPLANT

## 2011-08-18 NOTE — Anesthesia Procedure Notes (Addendum)
Procedure Name: Intubation Date/Time: 08/18/2011 7:09 PM Performed by: Despina Hidden Pre-anesthesia Checklist: Emergency Drugs available, Suction available, Timeout performed, Patient identified and Patient being monitored Patient Re-evaluated:Patient Re-evaluated prior to inductionOxygen Delivery Method: Circle System Utilized Preoxygenation: Pre-oxygenation with 100% oxygen Intubation Type: IV induction, Rapid sequence and Circoid Pressure applied Ventilation: Mask ventilation without difficulty Laryngoscope Size: 3 and Mac Grade View: Grade II Tube type: Oral Tube size: 7.0 mm Number of attempts: 1 Airway Equipment and Method: stylet Placement Confirmation: ETT inserted through vocal cords under direct vision,  positive ETCO2 and breath sounds checked- equal and bilateral Secured at: 22 cm Tube secured with: Tape Dental Injury: Teeth and Oropharynx as per pre-operative assessment

## 2011-08-18 NOTE — Addendum Note (Signed)
Addendum  created 08/18/11 2116 by Despina Hidden   Modules edited:Anesthesia Medication Administration

## 2011-08-18 NOTE — Anesthesia Postprocedure Evaluation (Signed)
  Anesthesia Post-op Note  Patient: Tammy Farrell  Procedure(s) Performed:  OPEN REDUCTION INTERNAL FIXATION (ORIF) ANKLE FRACTURE  Patient Location: PACU  Anesthesia Type: General  Level of Consciousness: awake, alert , oriented and patient cooperative  Airway and Oxygen Therapy: Patient Spontanous Breathing  Post-op Pain: 6 /10, moderate  Post-op Assessment: Post-op Vital signs reviewed, Patient's Cardiovascular Status Stable, Respiratory Function Stable, Patent Airway and No signs of Nausea or vomiting  Post-op Vital Signs: Reviewed and stable  Complications: No apparent anesthesia complications

## 2011-08-18 NOTE — Anesthesia Preprocedure Evaluation (Signed)
Anesthesia Evaluation   Patient awake    Reviewed: Allergy & Precautions, H&P , NPO status , Patient's Chart, lab work & pertinent test results, reviewed documented beta blocker date and time   Airway       Dental   Pulmonary asthma ,          Cardiovascular Exercise Tolerance: Good hypertension, Pt. on home beta blockers     Neuro/Psych    GI/Hepatic   Endo/Other    Renal/GU      Musculoskeletal   Abdominal   Peds  Hematology   Anesthesia Other Findings   Reproductive/Obstetrics                           Anesthesia Physical Anesthesia Plan  ASA: II  Anesthesia Plan: General   Post-op Pain Management:    Induction: Intravenous, Cricoid pressure planned and Rapid sequence  Airway Management Planned: Oral ETT  Additional Equipment:   Intra-op Plan:   Post-operative Plan: Post-operative intubation/ventilation  Informed Consent:   Plan Discussed with: Anesthesiologist  Anesthesia Plan Comments:         Anesthesia Quick Evaluation

## 2011-08-18 NOTE — H&P (Signed)
Tammy Farrell is an 50 y.o. female.   Chief Complaint: I hurt my left ankle HPI: She twisted her left ankle and fell at the same time at her home near her oil drum outside this afternoon.  She heard a pop and felt severe pain of the left ankle.  She has no other injury.  Xrays show a displaced dislocation of the left ankle medial and posteriorly with fracture of the medial malleolus and lateral malleolus with disruption of the syndesmosis of the ankle.  She last had something to drink at 11:30 am and same for something to eat.  Past Medical History  Diagnosis Date  . Hypertension     Past Surgical History  Procedure Date  . Abdominal hysterectomy   . Wrist fracture surgery left    History reviewed. No pertinent family history. Social History:  reports that she has quit smoking. She does not have any smokeless tobacco history on file. She reports that she does not drink alcohol or use illicit drugs.  Allergies: No Known Allergies  Medications Prior to Admission  Medication Dose Route Frequency Provider Last Rate Last Dose  . 0.9 %  sodium chloride infusion   Intravenous Continuous [name], MD 125 mL/hr at 08/18/11 1640    . HYDROmorphone (DILAUDID) injection 1 mg  1 mg Intravenous Once [name], MD   1 mg at 08/18/11 1639  . ondansetron (ZOFRAN) injection 4 mg  4 mg Intravenous Once [name], MD   4 mg at 08/18/11 1639   No current outpatient prescriptions on file as of 08/18/2011.    No results found for this or any previous visit (from the past 48 hour(s)). Dg Tibia/fibula Left  08/18/2011  *RADIOLOGY REPORT*  Clinical Data: Fall  LEFT TIBIA AND FIBULA - 2 VIEW  Comparison: None.  Findings: The proximal tibia and fibula are intact.  The distal fibula fracture and distal ankle dislocation are noted.  IMPRESSION: No evidence of bony injury in the mid or proximal tibia and fibula. Ankle injury is noted.  Original Report Authenticated By: Donavan Burnet, M.D.   Dg  Ankle Complete Left  08/18/2011  *RADIOLOGY REPORT*  Clinical Data: Larey Seat.  Injured left ankle.  LEFT ANKLE COMPLETE - 3+ VIEW  Comparison: None  Findings: There is a fracture dislocation involving the ankle. There is a posterior tibiotalar dislocation with an associated transverse fracture through the medial malleolus and a displaced distal fibular shaft fracture.  The talus is intact.  The subtalar joints are maintained.  IMPRESSION: Ankle fracture/dislocation.  Original Report Authenticated By: P. Loralie Champagne, M.D.   Dg Foot Complete Left  08/18/2011  *RADIOLOGY REPORT*  Clinical Data: Fall, pain  LEFT FOOT - COMPLETE 3+ VIEW  Comparison: None.  Findings: No foot fracture is seen. There is a severe ankle fracture with dislocation of the tibia anterior on the talus, incompletely evaluated on this foot series.  Please see report of the tibia.  IMPRESSION: As above.  Original Report Authenticated By: Elsie Stain, M.D.    Review of Systems  Constitutional: Negative.   HENT: Negative.   Eyes: Negative.   Respiratory: Negative.   Cardiovascular:       History of hypertension.  Gastrointestinal: Negative.   Genitourinary: Negative.   Musculoskeletal: Positive for joint pain (left ankle today) and falls (today).  Skin: Negative.   Neurological: Negative.   Endo/Heme/Allergies: Negative.   Psychiatric/Behavioral: Negative.     Blood pressure 141/78, pulse 62,  temperature 98 F (36.7 C), temperature source Oral, resp. rate 18, SpO2 99.00%. Physical Exam  Constitutional: She is oriented to person, place, and time. She appears well-developed and well-nourished.  HENT:  Head: Normocephalic and atraumatic.  Eyes: Conjunctivae and EOM are normal. Pupils are equal, round, and reactive to light.  Neck: Normal range of motion. Neck supple.  Cardiovascular: Normal rate, regular rhythm, normal heart sounds and intact distal pulses.   Respiratory: Effort normal and breath sounds normal.  GI:  Soft. Bowel sounds are normal.  Musculoskeletal: She exhibits tenderness (left ankle).       Left ankle: She exhibits decreased range of motion, swelling and deformity. tenderness. Lateral malleolus and medial malleolus tenderness found.       Feet:  Neurological: She is alert and oriented to person, place, and time. She has normal reflexes.  Skin: Skin is warm and dry.  Psychiatric: She has a normal mood and affect. Her behavior is normal. Judgment and thought content normal.     Assessment/Plan Fracture dislocation of the left ankle.  Plan to take to operating room shortly for surgery of the ankle.  I have discussed the significant findings with the patient and the risks and imponderables of the procedure including infection, embolism leading to death, need for post operative physical therapy, need to have a further surgery in about 6 to 8 weeks to remove the syndesmosis screw, and anesthesia risks among others.  She appears to understand and agrees to the procedure.  She asked appropriate questions.  Her daughter was present.    Jeena Arnett 08/18/2011, 5:27 PM

## 2011-08-18 NOTE — ED Notes (Signed)
Pt leaving for surgery at this time with OR team

## 2011-08-18 NOTE — Brief Op Note (Signed)
08/18/2011  8:31 PM  PATIENT:  Tammy Farrell  50 y.o. female  PRE-OPERATIVE DIAGNOSIS:  left ankle fracture/dislocation  POST-OPERATIVE DIAGNOSIS:  left ankle fracture/dislocation  PROCEDURE:  Procedure(s): Closed reduction of left ankle fracture under anesthesia, then OPEN REDUCTION INTERNAL FIXATION (ORIF) ANKLE FRACTURE  SURGEON:  Surgeon(s): [employer]  PHYSICIAN ASSISTANT:   ASSISTANTS: none   ANESTHESIA:   general  EBL:  Total I/O In: 700 [I.V.:700] Out: 625 [Urine:625]  BLOOD ADMINISTERED:none  DRAINS: none   LOCAL MEDICATIONS USED:  NONE  SPECIMEN:  No Specimen  DISPOSITION OF SPECIMEN:  N/A  COUNTS:  YES  TOURNIQUET:   Total Tourniquet Time Documented: Thigh (laterality) - 46 minutes  DICTATION: .Other Dictation: Dictation Number [id number]  PLAN OF CARE: Admit to inpatient   PATIENT DISPOSITION:  PACU - hemodynamically stable.   Delay start of Pharmacological VTE agent (>24hrs) due to surgical blood loss or risk of bleeding:no

## 2011-08-18 NOTE — ED Notes (Signed)
Pt tripped and fell. Pt has obvious deformity to L anterior/lateral ankle.pedal pulses strong. Denies hitting head or loc.

## 2011-08-18 NOTE — ED Provider Notes (Addendum)
Scribed for Tammy Melter, MD, the patient was seen in room APA16A/APA16A . This chart was scribed by Ellie Lunch.   CSN: [id number] Arrival date & time: 08/18/2011  3:41 PM   First MD Initiated Contact with Patient 08/18/11 1556      Chief Complaint  Patient presents with  . Ankle Injury    (Consider location/radiation/quality/duration/timing/severity/associated sxs/prior treatment) HPI PT seen at 3:58 PM Tammy Farrell is a 50 y.o. female who presents to the Emergency Department complaining of left ankle pain. Pt tripped in yard on uneven ground today. Pt was unable to get up due to pain in left ankle. Pain is described as constant and rated 10/10 in severity.  Nothing improves pain. Pain is worse on palpation. Pt denies any other pain or injury from fall. Denies pain to neck, back, chest, abdomen. No pain to right leg. No pain to left knee. There are no other associated symptoms and no other alleviating or aggravating factors.   Orthopedists previously seen Dr. Hilda Lias and Dr. Merlyn Lot.   Past Medical History  Diagnosis Date  . Hypertension     Past Surgical History  Procedure Date  . Abdominal hysterectomy   . Wrist fracture surgery left    History reviewed. No pertinent family history.  History  Substance Use Topics  . Smoking status: Former Games developer  . Smokeless tobacco: Not on file  . Alcohol Use: No    Review of Systems  HENT: Negative for neck pain.   Cardiovascular: Negative for chest pain.  Gastrointestinal: Negative for abdominal pain.  Musculoskeletal: Negative for back pain.       Left ankle pain  Neurological: Negative for syncope.  All other systems reviewed and are negative.   Allergies  Review of patient's allergies indicates no known allergies.  Home Medications   Current Outpatient Rx  Name Route Sig Dispense Refill  . AMLODIPINE BESYLATE 10 MG PO TABS Oral Take 10 mg by mouth daily.      Marland Kitchen CETIRIZINE HCL 10 MG PO TABS Oral Take 10 mg by  mouth daily.      . FUROSEMIDE 20 MG PO TABS Oral Take 20 mg by mouth 2 (two) times daily.      Marland Kitchen METOPROLOL TARTRATE 50 MG PO TABS Oral Take 50 mg by mouth 2 (two) times daily.      Marland Kitchen PRAVASTATIN SODIUM 20 MG PO TABS Oral Take 20 mg by mouth daily.      Marland Kitchen ZOLPIDEM TARTRATE 10 MG PO TABS Oral Take 10 mg by mouth at bedtime as needed. For sleep       BP 141/78  Pulse 62  Temp(Src) 98 F (36.7 C) (Oral)  Resp 18  SpO2 99%  Physical Exam  Nursing note and vitals reviewed. Constitutional: She is oriented to person, place, and time. She appears well-developed and well-nourished.  HENT:  Head: Normocephalic and atraumatic.  Eyes: Conjunctivae are normal. No scleral icterus.  Neck: Normal range of motion. Neck supple.  Pulmonary/Chest: Effort normal. No respiratory distress.  Musculoskeletal:       Able to wiggle left toes.  Good dorsalis pedis pulse. Left foot.    Deformity left mid foot.  Tenderness left ankle, medial midfoot, posterior heel, distal tibia.   Neurological: She is alert and oriented to person, place, and time.  Skin: Skin is warm and dry.  Psychiatric: She has a normal mood and affect. Thought content normal.    ED Course  Procedures (including critical care time)  OTHER DATA REVIEWED: Nursing notes, vital signs, and past medical records reviewed.   DIAGNOSTIC STUDIES: Oxygen Saturation is 99% on room air, normal by my interpretation.     Dg Tibia/fibula Left  08/18/2011  *RADIOLOGY REPORT*  Clinical Data: Fall  LEFT TIBIA AND FIBULA - 2 VIEW  Comparison: None.  Findings: The proximal tibia and fibula are intact.  The distal fibula fracture and distal ankle dislocation are noted.  IMPRESSION: No evidence of bony injury in the mid or proximal tibia and fibula. Ankle injury is noted.  Original Report Authenticated By: Donavan Burnet, M.D.   Dg Ankle Complete Left  08/18/2011  *RADIOLOGY REPORT*  Clinical Data: Larey Seat.  Injured left ankle.  LEFT ANKLE COMPLETE - 3+  VIEW  Comparison: None  Findings: There is a fracture dislocation involving the ankle. There is a posterior tibiotalar dislocation with an associated transverse fracture through the medial malleolus and a displaced distal fibular shaft fracture.  The talus is intact.  The subtalar joints are maintained.  IMPRESSION: Ankle fracture/dislocation.  Original Report Authenticated By: P. Loralie Champagne, M.D.   Dg Foot Complete Left  08/18/2011  *RADIOLOGY REPORT*  Clinical Data: Fall, pain  LEFT FOOT - COMPLETE 3+ VIEW  Comparison: None.  Findings: No foot fracture is seen. There is a severe ankle fracture with dislocation of the tibia anterior on the talus, incompletely evaluated on this foot series.  Please see report of the tibia.  IMPRESSION: As above.  Original Report Authenticated By: Elsie Stain, M.D.     ED MEDICATIONS Medications  HYDROmorphone (DILAUDID) injection 1 mg  ondansetron (ZOFRAN) injection 4 mg   0.9 %  sodium chloride infusion    5:09 PM EDP consult with Dr. Hilda Lias. Agrees to see Pt.  17:40 EDP Consult with Dr. Hilda Lias. Dr. Hilda Lias plans to relocate ankle in OR. Requested EDP order pain medication for procedure.   1. Fractured tibia   2. Fracture, fibula   3. Ankle dislocation       MDM  Accidental fall with fracture, dislocation, left ankle. In emergency department. She has a pulse in the left foot. Therefore, the fracture, dislocation was reduced. The orthopedist, was immediately available, evaluated the patient in emergency department, and took her to the operating room for operative repair  I personally performed the services described in this documentation, which was scribed in my presence. The recorded information has been reviewed and considered.          Tammy Melter, MD 08/18/11 1810  Tammy Melter, MD 08/18/11 1610

## 2011-08-18 NOTE — Transfer of Care (Signed)
Immediate Anesthesia Transfer of Care Note  Patient: Tammy Farrell  Procedure(s) Performed:  OPEN REDUCTION INTERNAL FIXATION (ORIF) ANKLE FRACTURE  Patient Location: PACU  Anesthesia Type: General  Level of Consciousness: alert  and patient cooperative  Airway & Oxygen Therapy: Patient Spontanous Breathing and Patient connected to face mask oxygen  Post-op Assessment: Report given to PACU RN, Post -op Vital signs reviewed and stable, Patient moving all extremities and Patient moving all extremities X 4  Post vital signs: Reviewed and stable  Complications: No apparent anesthesia complications

## 2011-08-19 MED ORDER — INFLUENZA VAC TYP A&B SURF ANT IM INJ
0.5000 mL | INJECTION | INTRAMUSCULAR | Status: AC
  Administered 2011-08-20: 0.5 mL via INTRAMUSCULAR
  Filled 2011-08-19: qty 0.5

## 2011-08-19 NOTE — Addendum Note (Signed)
Addendum  created 08/19/11 1058 by Despina Hidden   Modules edited:Notes Section

## 2011-08-19 NOTE — Progress Notes (Signed)
Physical Therapy Treatment Patient Details Name: ZEPHANIAH ENYEART MRN: [id number] DOB: 1960/11/21 Today's Date: 08/19/2011  PT Assessment/Plan  PT - Assessment/Plan Comments on Treatment Session: pt is able to mobilize bed to chair with mod Independence...gait extremely limited due to obesity and weight bearing status...will definately need a w/c at home PT Plan: Discharge plan remains appropriate PT Frequency: Min 5X/week Follow Up Recommendations: Home health PT Equipment Recommended: Wheelchair (measurements);3 in 1 bedside comode;Other (comment) (w/c with elevating legrest) PT Goals  Acute Rehab PT Goals PT Goal Formulation: With patient Time For Goal Achievement: 4 days Pt will Ambulate: 1 - 15 feet;with supervision;with standard walker PT Goal: Ambulate - Progress: Progressing toward goal  PT Treatment Precautions/Restrictions  Precautions Precautions: Fall Required Braces or Orthoses: No Restrictions Weight Bearing Restrictions: Yes LLE Weight Bearing: Touchdown weight bearing Mobility (including Balance) Bed Mobility Bed Mobility: Yes Supine to Sit: 6: Modified independent (Device/Increase time) Sitting - Scoot to Edge of Bed: 6: Modified independent (Device/Increase time) Sit to Supine - Right: 6: Modified independent (Device/Increase time) Transfers Transfers: Yes Sit to Stand: 6: Modified independent (Device/Increase time) Stand to Sit: 6: Modified independent (Device/Increase time) Squat Pivot Transfers: 6: Modified independent (Device/Increase time) Ambulation/Gait Ambulation/Gait: Yes Ambulation/Gait Assistance: 5: Supervision Ambulation/Gait Assistance Details (indicate cue type and reason): needs to lean forearms on walker in order to offload a bit of weight from RLE in order to step Ambulation Distance (Feet): 4 Feet Assistive device: Standard walker Gait Pattern: Decreased step length - right;Decreased hip/knee flexion - right;Antalgic Gait velocity: very  slow, labored Stairs: No Wheelchair Mobility Wheelchair Mobility: No  Posture/Postural Control Posture/Postural Control: No significant limitations Balance Balance Assessed: No Exercise    End of Session PT - End of Session Equipment Utilized During Treatment: Gait belt Activity Tolerance: Patient tolerated treatment well Patient left: in bed;with call bell in reach;with family/visitor present General Behavior During Session: [facility] for tasks performed Cognition: [facility] for tasks performed  Konrad Penta 08/19/2011, 1:23 PM

## 2011-08-19 NOTE — Progress Notes (Signed)
Physical Therapy Evaluation Patient Details Name: Tammy Farrell MRN: [id number] DOB: 02-09-1961 Today's Date: 08/19/2011  Problem List: There is no problem list on file for this patient.   Past Medical History:  Past Medical History  Diagnosis Date  . Hypertension    Past Surgical History:  Past Surgical History  Procedure Date  . Abdominal hysterectomy   . Wrist fracture surgery left    PT Assessment/Plan/Recommendation PT Assessment Clinical Impression Statement: very pleasant, obese pt who was instructed in gait with walker, TTWB L following OTIF  L ankle...has excellent family support...she had extreme difficulty trying to take any steps with walker due to deconditioning and obesity...will need a w/c with elevating leg rest for general mobility in home (at least initially) as well as BSC and HHPT to maximize home safety PT Recommendation/Assessment: Patient will need skilled PT in the acute care venue PT Problem List: Decreased mobility;Decreased knowledge of use of DME;Decreased knowledge of precautions;Obesity;Pain Barriers to Discharge: None PT Therapy Diagnosis : Difficulty walking;Acute pain PT Plan PT Frequency: Min 5X/week PT Treatment/Interventions: DME instruction;Gait training;Patient/family education PT Recommendation Follow Up Recommendations: Home health PT Equipment Recommended: Wheelchair (measurements);3 in 1 bedside comode;Other (comment) (w/c with elevating legrest) PT Goals  Acute Rehab PT Goals PT Goal Formulation: With patient Time For Goal Achievement: 4 days Pt will Ambulate: 1 - 15 feet;with supervision;with standard walker  PT Evaluation Precautions/Restrictions  Precautions Precautions: Fall Required Braces or Orthoses: No Restrictions Weight Bearing Restrictions: Yes LLE Weight Bearing: Touchdown weight bearing Prior Functioning  Home Living Lives With: Spouse Receives Help From: Family Type of Home: House Home Layout: One level Home  Access: Stairs to enter Entrance Stairs-Rails: None Entrance Stairs-Number of Steps: 1 Home Adaptive Equipment: Walker - standard Prior Function Level of Independence: Independent with basic ADLs;Independent with homemaking with ambulation;Independent with gait;Independent with transfers Driving: Yes Vocation: Full time employment Cognition Cognition Arousal/Alertness: Awake/alert Overall Cognitive Status: Appears within functional limits for tasks assessed Orientation Level: Oriented X4 Sensation/Coordination Sensation Light Touch: Appears Intact Stereognosis: Not tested Hot/Cold: Not tested Proprioception: Not tested Extremity Assessment RUE Assessment RUE Assessment: Within Functional Limits LUE Assessment LUE Assessment: Within Functional Limits RLE Assessment RLE Assessment: Within Functional Limits LLE Assessment LLE Assessment: Not tested Mobility (including Balance) Bed Mobility Bed Mobility: Yes Supine to Sit: 6: Modified independent (Device/Increase time) Sitting - Scoot to Edge of Bed: 6: Modified independent (Device/Increase time) Transfers Transfers: Yes Sit to Stand: 6: Modified independent (Device/Increase time) Stand to Sit: 6: Modified independent (Device/Increase time) Ambulation/Gait Ambulation/Gait: Yes Ambulation/Gait Assistance: 5: Supervision Ambulation/Gait Assistance Details (indicate cue type and reason): unable to take R foot off of floor due to shoulder girdle weakness...instructed in pivoting R foot in order to advance position Ambulation Distance (Feet): 2 Feet Assistive device: Rolling walker Gait Pattern: Decreased step length - right;Decreased hip/knee flexion - right;Antalgic Gait velocity: extremely slow and labored Stairs: No Wheelchair Mobility Wheelchair Mobility: No  Posture/Postural Control Posture/Postural Control: No significant limitations Balance Balance Assessed: No Exercise    End of Session PT - End of  Session Equipment Utilized During Treatment: Gait belt Activity Tolerance: Patient tolerated treatment well Patient left: in chair;with call bell in reach;with family/visitor present General Behavior During Session: [facility] for tasks performed Cognition: [facility] for tasks performed  Konrad Penta 08/19/2011, 10:49 AM

## 2011-08-19 NOTE — Op Note (Signed)
Tammy Farrell, Tammy Farrell                ACCOUNT NO.:  [id number]  MEDICAL RECORD NO.:  [id number]  LOCATION:  A325                          FACILITY:  [facility]  PHYSICIAN:  J. Darreld Mclean, M.D. DATE OF BIRTH:  [birth year]  DATE OF PROCEDURE:  08/18/2011 DATE OF DISCHARGE:                              OPERATIVE REPORT   PREOPERATIVE DIAGNOSIS:  Fracture dislocation of the left ankle, closed.  POSTOPERATIVE DIAGNOSIS:  Fracture dislocation of the left ankle, closed.  PROCEDURE:  Closed reduction under anesthesia of the ankle dislocation, open treatment and internal fixation of the bimalleolar fracture of the left ankle.  ANESTHESIA:  General.  TOURNIQUET TIME:  46 minutes.  SURGEON:  J. Darreld Mclean, MD  No drains.  Posterior splint applied at the end of the procedure.  INDICATIONS:  The patient is a 50 year old female who fell today and sustained the above-mentioned injury, seen in the emergency room short time ago.  Seen and evaluated.  Tammy Farrell understood procedure now.  She had not anything to eat or drink since 11:30 this morning.  I went over the risks and imponderables of the procedure with her and she appeared to understand and agreed to the procedure as outlined.  The patient was seen in the emergency room.  A mark was made on her left ankle.  The patient was brought to the operating room, placed supine on the operating room table, and general anesthesia was given.  Tourniquet placed, deflated in left upper thigh.  She was prepped and draped in the usual manner.  We identified the patient as Tammy Farrell with a generalized time-out.  We were going to do a left ankle fracture, closed reduction was carried out and the marks were made for size to make the surgical incision.  C-arm fluoroscopy unit was brought in and Radiology time-out. We went ahead and wore protective aprons and thyroid shields and had x- ray bandages.  Machine was working properly.  With x-ray,  closed reduction looked very nice and bimalleolar fracture was identified.  The C-arm was removed.  The patient then once again to completed the surgical time-out.  The OR team knew each other we were doing the left ankle, identified the patient as Tammy Farrell and all instrumentation properly positioned and working.  I used an Esmarch and wrapped it and elevated the leg circumferentially.  Tourniquet inflated to 350 mmHg. Esmarch bandage removed.  Medial incision was made first.  Medial fracture was identified and cleaned of hematoma and reduced.  A guidepin was placed.  A 0.062 Kirschner wire and then a 3.2 drill was made and 55- mm malleolar screw was inserted.  This confirmed good reduction with the C-arm fluoroscopy unit.  Attention directed to lateral side.  Incision was made through skin to the fascia, to the bone.  An 8-hole plate was selected.  I used the locking screws, so-called whirlybird, to hold the plate in place and then screw holes were made.  They measured from 18 mm to 14 mm.  A syndesmosis screw was placed and this was a 38-mm long screw.  Locking screw was placed.  Permanent x-rays were taken, looked good in AP and lateral  views.  Mortise was restored.  Syndesmosis was restored.  I then closed the wound using 2-0 chromic, 2-0 plain, and skin staples on both sides.  Tourniquet deflated after 46 minutes. Sterile dressing applied.  Bulky dressing applied.  Posterior plaster splint was applied.  The patient tolerated the procedure well, will go to the PACU in good condition.          ______________________________ Shela Commons. Darreld Mclean, M.D.     JWK/MEDQ  D:  08/18/2011  T:  08/18/2011  Job:  [id number]

## 2011-08-19 NOTE — Progress Notes (Signed)
Subjective: 1 Day Post-Op Procedure(s) (LRB): OPEN REDUCTION INTERNAL FIXATION (ORIF) ANKLE FRACTURE (Left) Patient reports pain as 5 on 0-10 scale.    Objective: Vital signs in last 24 hours: Temp:  [97.7 F (36.5 C)-98.7 F (37.1 C)] 98.6 F (37 C) (11/09 0551) Pulse Rate:  [54-88] 88  (11/09 0551) Resp:  [11-20] 20  (11/09 0551) BP: (141-169)/(78-105) 167/87 mmHg (11/09 0551) SpO2:  [93 %-100 %] 95 % (11/09 0551) Weight:  [117.4 kg (258 lb 13.1 oz)] 258 lb 13.1 oz (117.4 kg) (11/08 2153)  Intake/Output from previous day: 11/08 0701 - 11/09 0700 In: 2744.4 [P.O.:240; I.V.:2454.4; IV Piggyback:50] Out: 625 [Urine:625] Intake/Output this shift: Total I/O In: 2444.4 [P.O.:240; I.V.:2154.4; IV Piggyback:50] Out: 625 [Urine:625]   Basename 08/18/11 1735  HGB 14.3    Basename 08/18/11 1735  WBC 8.5  RBC 5.04  HCT 44.5  PLT 283    Basename 08/18/11 1735  NA 140  K 3.9  CL 105  CO2 24  BUN 29*  CREATININE 2.06*  GLUCOSE 93  CALCIUM 9.7   No results found for this basename: LABPT:2,INR:2 in the last 72 hours  Neurologically intact Neurovascular intact Sensation intact distally Intact pulses distally  Assessment/Plan: 1 Day Post-Op Procedure(s) (LRB): OPEN REDUCTION INTERNAL FIXATION (ORIF) ANKLE FRACTURE (Left) Up with therapy Plan for discharge tomorrow To remove foley today.  Jered Heiny 08/19/2011, 6:57 AM

## 2011-08-19 NOTE — Progress Notes (Signed)
Pte s/p ankle surgery. D/c plan home tomorrow. Received home health PT referral and referral for wheelchair and 3 in 1. Which will be delivered today [facility]. Also [facility]  [facility]. Referral given to [facility] rn.

## 2011-08-19 NOTE — Progress Notes (Signed)
UR Chart Review Completed  

## 2011-08-19 NOTE — Anesthesia Postprocedure Evaluation (Signed)
  Anesthesia Post-op Note  Patient: Tammy Farrell  Procedure(s) Performed:  OPEN REDUCTION INTERNAL FIXATION (ORIF) ANKLE FRACTURE  Patient Location: room 325  Anesthesia Type: General  Level of Consciousness: awake, alert , oriented and patient cooperative  Airway and Oxygen Therapy: Patient Spontanous Breathing  Post-op Pain: 7 /10, moderate  Post-op Assessment: Post-op Vital signs reviewed, Patient's Cardiovascular Status Stable, Respiratory Function Stable, Patent Airway and No signs of Nausea or vomiting  Post-op Vital Signs: Reviewed and stable  Complications: No apparent anesthesia complications

## 2011-08-20 MED ORDER — OXYCODONE-ACETAMINOPHEN 7.5-325 MG PO TABS: 1.0000 | ORAL_TABLET | ORAL | Status: AC | PRN

## 2011-08-20 NOTE — Progress Notes (Signed)
Pt discharged home today per Dr. Hilda Lias.  Pt's IV site D/C'd and WNL. Pt's VS stable at this time. Pt provided with home medication list and discharge instructions. Pt verbalized understanding. Pt left floor via WC in stable condition accompanied by NT. Dagoberto Ligas, RN

## 2011-08-20 NOTE — Progress Notes (Signed)
Subjective: 2 Days Post-Op Procedure(s) (LRB): OPEN REDUCTION INTERNAL FIXATION (ORIF) ANKLE FRACTURE (Left) Patient reports pain as 2 on 0-10 scale.    Objective: Vital signs in last 24 hours: Temp:  [98.5 F (36.9 C)-99.4 F (37.4 C)] 98.5 F (36.9 C) (11/10 0531) Pulse Rate:  [60-86] 60  (11/10 0847) Resp:  [16-20] 19  (11/10 0847) BP: (137-174)/(84-97) 146/90 mmHg (11/10 0531) SpO2:  [93 %-98 %] 97 % (11/10 0847) Weight:  [118.1 kg (260 lb 5.8 oz)] 260 lb 5.8 oz (118.1 kg) (11/10 0531)  Intake/Output from previous day: 11/09 0701 - 11/10 0700 In: 1080 [P.O.:1080] Out: 2650 [Urine:2650] Intake/Output this shift:     Basename 08/18/11 1735  HGB 14.3    Basename 08/18/11 1735  WBC 8.5  RBC 5.04  HCT 44.5  PLT 283    Basename 08/18/11 1735  NA 140  K 3.9  CL 105  CO2 24  BUN 29*  CREATININE 2.06*  GLUCOSE 93  CALCIUM 9.7   No results found for this basename: LABPT:2,INR:2 in the last 72 hours  Neurologically intact Neurovascular intact Sensation intact distally Intact pulses distally No cellulitis present She did well in PT. Home health is arranged.  Assessment/Plan: 2 Days Post-Op Procedure(s) (LRB): OPEN REDUCTION INTERNAL FIXATION (ORIF) ANKLE FRACTURE (Left) D/C IV fluids Discharge home with home health  Tammy Farrell 08/20/2011, 9:09 AM

## 2011-08-20 NOTE — Discharge Summary (Signed)
Physician Discharge Summary  Patient ID: Tammy Farrell MRN: [id number] DOB/AGE: 1961-08-25 50 y.o.  Admit date: 08/18/2011 Discharge date: 08/20/2011  Admission Diagnoses:Fracture/dislocation of the left ankle with bimalleolar fracture, closed  Discharge Diagnoses: Fracture/dislocation of the left ankle with bimalleolar fracture, closed Active Problems:  * No active hospital problems. *    Discharged Condition: good  Hospital Course: She fell at home and sustained a closed dislocation of the left ankle with bimalleolar fracture, closed.  She was seen in the ER and then taken to surgery the same evening.  She underwent closed reduction under anesthesia of the left ankle dislocation and then open treatment internal fixation of the bimalleolar fracture.  She was admitted after the surgery. Post operative she has done well.  She has remained afebrile.  She has been seen by physical therapy and has made good progress.  Pain has been controlled.  She has home health arranged post discharge.  Consults: none  Significant Diagnostic Studies: radiology: left ankle  Treatments: surgery: of the left ankle as stated above  Discharge Exam: Blood pressure 146/90, pulse 60, temperature 98.5 F (36.9 C), temperature source Oral, resp. rate 19, height 5\' 3"  (1.6 m), weight 118.1 kg (260 lb 5.8 oz), SpO2 97.00%. General appearance: alert and cooperative.  She has a posterior splint on the left lower leg.  She has normal neurovascular status.    Disposition: Home with home health.  Discharge Orders    Future Orders Please Complete By Expires   Diet - low sodium heart healthy      Call MD / Call 911      Comments:   If you experience chest pain or shortness of breath, CALL 911 and be transported to the hospital emergency room.  If you develope a fever above 101 F, pus (white drainage) or increased drainage or redness at the wound, or calf pain, call your surgeon's office.   Constipation Prevention       Comments:   Drink plenty of fluids.  Prune juice may be helpful.  You may use a stool softener, such as Colace (over the counter) 100 mg twice a day.  Use MiraLax (over the counter) for constipation as needed.   Increase activity slowly as tolerated      Weight Bearing as taught in Physical Therapy      Comments:   Use a walker or crutches as instructed.   Discharge instructions      Comments:   Elevate left ankle.  Do not walk on left foot or ankle, toe touch only Keep ankle dry.   Driving restrictions      Comments:   No driving for 6 weeks     Current Discharge Medication List    START taking these medications   Details  oxyCODONE-acetaminophen (PERCOCET) 7.5-325 MG per tablet Take 1 tablet by mouth every 4 (four) hours as needed for pain. Qty: 80 tablet, Refills: 0      CONTINUE these medications which have NOT CHANGED   Details  amLODipine (NORVASC) 10 MG tablet Take 10 mg by mouth daily.      cetirizine (ZYRTEC) 10 MG tablet Take 10 mg by mouth daily.      furosemide (LASIX) 20 MG tablet Take 20 mg by mouth 2 (two) times daily.      metoprolol (LOPRESSOR) 50 MG tablet Take 50 mg by mouth 2 (two) times daily.      pravastatin (PRAVACHOL) 20 MG tablet Take 20 mg by mouth daily.  zolpidem (AMBIEN) 10 MG tablet Take 10 mg by mouth at bedtime as needed. For sleep        Follow-up Information    [facility] [phone]. (Also call Dr. Sanjuan Dame office for appointment in two weeks)          Signed: Darreld Mclean 08/20/2011, 9:15 AM

## 2011-08-20 NOTE — Progress Notes (Signed)
[facility] called and made aware of pt's discharge home today. [facility] stated they would call pt tonight and would be out to the pt's house to see her tomorrow.

## 2011-08-29 ENCOUNTER — Encounter (HOSPITAL_COMMUNITY): Payer: Self-pay | Admitting: Orthopaedic Surgery

## 2011-09-08 ENCOUNTER — Other Ambulatory Visit (HOSPITAL_COMMUNITY): Payer: Self-pay | Admitting: Physician Assistant

## 2011-09-08 DIAGNOSIS — Z139 Encounter for screening, unspecified: Secondary | ICD-10-CM

## 2011-09-13 ENCOUNTER — Ambulatory Visit (HOSPITAL_COMMUNITY)
Admission: RE | Admit: 2011-09-13 | Discharge: 2011-09-13 | Disposition: A | Discharge status: Home / Self Care | Payer: BC Managed Care – PPO | Source: Ambulatory Visit | Attending: Physician Assistant | Admitting: Physician Assistant

## 2011-09-13 DIAGNOSIS — Z139 Encounter for screening, unspecified: Secondary | ICD-10-CM

## 2011-09-13 DIAGNOSIS — Z1231 Encounter for screening mammogram for malignant neoplasm of breast: Secondary | ICD-10-CM | POA: Insufficient documentation

## 2011-10-06 ENCOUNTER — Encounter (HOSPITAL_COMMUNITY): Payer: Self-pay | Admitting: Pharmacy Technician

## 2011-10-13 ENCOUNTER — Encounter (HOSPITAL_COMMUNITY): Payer: Self-pay

## 2011-10-13 ENCOUNTER — Encounter (HOSPITAL_COMMUNITY)
Admission: RE | Admit: 2011-10-13 | Discharge: 2011-10-13 | Payer: BC Managed Care – PPO | Source: Ambulatory Visit | Attending: Orthopaedic Surgery | Admitting: Orthopaedic Surgery

## 2011-10-13 ENCOUNTER — Other Ambulatory Visit: Payer: Self-pay

## 2011-10-13 HISTORY — DX: Chronic kidney disease, unspecified: N18.9

## 2011-10-13 HISTORY — DX: Hyperlipidemia, unspecified: E78.5

## 2011-10-13 HISTORY — DX: Anxiety disorder, unspecified: F41.9

## 2011-10-13 HISTORY — DX: Unspecified convulsions: R56.9

## 2011-10-13 LAB — COMPREHENSIVE METABOLIC PANEL
ALT: 16 U/L (ref 0–35)
AST: 13 U/L (ref 0–37)
Albumin: 3.5 g/dL (ref 3.5–5.2)
Alkaline Phosphatase: 128 U/L — ABNORMAL HIGH (ref 39–117)
BUN: 20 mg/dL (ref 6–23)
CO2: 25 mEq/L (ref 19–32)
Calcium: 10.2 mg/dL (ref 8.4–10.5)
Chloride: 108 mEq/L (ref 96–112)
Creatinine, Ser: 1.82 mg/dL — ABNORMAL HIGH (ref 0.50–1.10)
GFR calc Af Amer: 36 mL/min — ABNORMAL LOW (ref >90)
GFR calc non Af Amer: 31 mL/min — ABNORMAL LOW (ref >90)
Glucose, Bld: 102 mg/dL — ABNORMAL HIGH (ref 70–99)
Potassium: 3.6 mEq/L (ref 3.5–5.1)
Sodium: 139 mEq/L (ref 135–145)
Total Bilirubin: 0.4 mg/dL (ref 0.3–1.2)
Total Protein: 7.2 g/dL (ref 6.0–8.3)

## 2011-10-13 LAB — CBC
HCT: 38.9 % (ref 36.0–46.0)
Hemoglobin: 12.6 g/dL (ref 12.0–15.0)
MCH: 27.9 pg (ref 26.0–34.0)
MCHC: 32.4 g/dL (ref 30.0–36.0)
MCV: 86.1 fL (ref 78.0–100.0)
Platelets: 281 10*3/uL (ref 150–400)
RBC: 4.52 MIL/uL (ref 3.87–5.11)
RDW: 13.8 % (ref 11.5–15.5)
WBC: 7.4 10*3/uL (ref 4.0–10.5)

## 2011-10-13 LAB — DIFFERENTIAL
Basophils Absolute: 0 10*3/uL (ref 0.0–0.1)
Basophils Relative: 0 % (ref 0–1)
Eosinophils Absolute: 0.1 10*3/uL (ref 0.0–0.7)
Eosinophils Relative: 2 % (ref 0–5)
Lymphocytes Relative: 40 % (ref 12–46)
Lymphs Abs: 3 10*3/uL (ref 0.7–4.0)
Monocytes Absolute: 0.4 10*3/uL (ref 0.1–1.0)
Monocytes Relative: 6 % (ref 3–12)
Neutro Abs: 3.9 10*3/uL (ref 1.7–7.7)
Neutrophils Relative %: 52 % (ref 43–77)

## 2011-10-13 LAB — SURGICAL PCR SCREEN
MRSA, PCR: NEGATIVE
Staphylococcus aureus: NEGATIVE

## 2011-10-13 NOTE — Patient Instructions (Signed)
20 Tammy Farrell  10/13/2011   Your procedure is scheduled on:  10/18/11  Report to [facility] at 07:00 AM.  Call this number if you have problems the morning of surgery: [phone]   Remember:   Do not eat food:After Midnight.  May have clear liquids:until Midnight .  Clear liquids include soda, tea, black coffee, apple or grape juice, broth.  Take these medicines the morning of surgery with A SIP OF WATER: Lisinopril and Metoprolol. Take your Oxycodone only if needed. Also, take your inhaler, Nasonex.   Do not wear jewelry, make-up or nail polish.  Do not wear lotions, powders, or perfumes. You may wear deodorant.  Do not shave 48 hours prior to surgery.  Do not bring valuables to the hospital.  Contacts, dentures or bridgework may not be worn into surgery.  Leave suitcase in the car. After surgery it may be brought to your room.  For patients admitted to the hospital, checkout time is 11:00 AM the day of discharge.   Patients discharged the day of surgery will not be allowed to drive home.  Name and phone number of your driver:   Special Instructions: CHG Shower Use Special Wash: 1/2 bottle night before surgery and 1/2 bottle morning of surgery.   Please read over the following fact sheets that you were given: Pain Booklet, MRSA Information, Surgical Site Infection Prevention, Anesthesia Post-op Instructions and Care and Recovery After Surgery    Hardware Removal Hardware removal is a procedure that removes medical devices used to repair broken bones. Hardware may be pins, screws, rods, wires, plates, or other implants. Some types of hardware are meant to stay in place permanently. Other types of hardware may be removed after the broken bone has healed. Sometimes, hardware is removed because it causes problems. These problems include infection, ongoing pain, or failure of the device. In certain cases, older types of hardware may be replaced with newer, better materials. Young children  often need to have hardware removed in order to allow for proper bone growth. LET YOUR CAREGIVER KNOW ABOUT:   Allergies to food or medicine.   Medicines taken, including vitamins, herbs, eyedrops, over-the-counter medicines, and creams.   Use of steroids (by mouth or creams).   Previous problems with anesthetics or numbing medicines.   History of bleeding problems or blood clots.   Previous surgery.   Other health problems, including diabetes and kidney problems.   Possibility of pregnancy, if this applies.  RISKS AND COMPLICATIONS   Infection.   Bleeding.   Pain.   Bone breaking again (refracture).   Failure to completely remove all implants.  BEFORE THE PROCEDURE   You may be asked to stop taking blood thinners, aspirin, and/or nonsteroidal anti-inflammatory drugs (like ibuprofen) before the procedure.   You will usually be asked to stop eating and drinking at least 6 hours before the procedure.   If your procedure is done so that you go home the same day (outpatient), you will need someone to drive you home.  PROCEDURE   You will be asked to change into a hospital gown.   You will lie on an exam table. A variety of monitors will be connected to you in order to track your heart rate, blood pressure, and breathing throughout the procedure.   You will have an intravenous (IV) access placed in your vein.   You may be given general anesthesia to help you sleep through the procedure or a sedative to relax you. You  will also be given a local or regional anesthetic to numb the area.   X-rays may be taken to accurately find the hardware.   The surgeon will make a cut (incision) over the area where the hardware is located.   The hardware will be carefully removed.   The incision will be closed with stitches (sutures), staples, or special glue. A bandage will be placed over the area to keep it clean and dry.   Often splints, casts, or removable walking boots are used to  protect your limb while your wound heals.  AFTER THE PROCEDURE   You may be sleepy.   You may have some pain or feel sick to your stomach (nauseous). This can usually be controlled with medicines.   You will stay in the recovery room until you are awake and able to drink fluids.   Check with your caregiver about when you can return to your usual level of activity or whether you will need physical therapy or rehabilitation.  HOME CARE INSTRUCTIONS   Take all medicines exactly as directed.   Follow any prescribed diet.   Follow instructions regarding both rest and physical activity. Be sure you understand when it is okay to bear weight.  SEEK IMMEDIATE MEDICAL CARE IF:   You have severe or lasting pain.   You or your child has an oral temperature above 102 F (38.9 C), not controlled by medicine.   Chills develop.   The incision area appears red, hot, puffy (swollen), or covered with pus.   You have difficulty breathing.   You cannot pass gas.   You are unable to have a bowel movement within 2 days.   You have persistent numbness in the limb beyond 24 hours.  MAKE SURE YOU:   Understand these instructions.   Will watch your condition.   Will get help right away if you are not doing well or get worse.  Document Released: 07/24/2009 Document Revised: 06/08/2011 Document Reviewed: 07/24/2009 [facility] Patient Information 2012 [place], [place].   PATIENT INSTRUCTIONS POST-ANESTHESIA  IMMEDIATELY FOLLOWING SURGERY:  Do not drive or operate machinery for the first twenty four hours after surgery.  Do not make any important decisions for twenty four hours after surgery or while taking narcotic pain medications or sedatives.  If you develop intractable nausea and vomiting or a severe headache please notify your doctor immediately.  FOLLOW-UP:  Please make an appointment with your surgeon as instructed. You do not need to follow up with anesthesia unless specifically instructed  to do so.  WOUND CARE INSTRUCTIONS (if applicable):  Keep a dry clean dressing on the anesthesia/puncture wound site if there is drainage.  Once the wound has quit draining you may leave it open to air.  Generally you should leave the bandage intact for twenty four hours unless there is drainage.  If the epidural site drains for more than 36-48 hours please call the anesthesia department.  QUESTIONS?:  Please feel free to call your physician or the hospital operator if you have any questions, and they will be happy to assist you.     [facility] Anesthesia Department [address] [place] [phone]

## 2011-10-14 NOTE — Pre-Procedure Instructions (Signed)
Pt will need to re-do UA morning of surgery, 10/18/11, because she didn't give enough urine for lab to run her UA at pre-op interview. Pre-op order for UA was discontinued by lab, but I put new order in, just release it morning of surgery.

## 2011-10-17 NOTE — H&P (Signed)
Tammy Farrell is an 51 y.o. female.   Chief Complaint: For removal of screw from the left ankle HPI: She had an ankle fracture on the left side with rupture of the syndesmosis.  She had surgery on the left ankle and has done well.  She is now returning to surgery for planned, staged removal of the syndesmosis screw of the left ankle.    Past Medical History  Diagnosis Date  . Hypertension   . Hyperlipidemia   . Chronic kidney disease     Polycystic Kidney Disease  . Asthma   . Seizures     x1 many years ago when pt was being pre-oped for another surgery  . Anxiety     Past Surgical History  Procedure Date  . Abdominal hysterectomy   . Wrist fracture surgery left  . Orif ankle fracture 08/18/2011    Procedure: OPEN REDUCTION INTERNAL FIXATION (ORIF) ANKLE FRACTURE;  Surgeon: Darreld Mclean;  Location: [facility];  Service: Orthopedics;  Laterality: Left;    Family History  Problem Relation Age of Onset  . Anesthesia problems Neg Hx   . Hypotension Neg Hx   . Malignant hyperthermia Neg Hx   . Pseudochol deficiency Neg Hx    Social History:  reports that she has never smoked. She does not have any smokeless tobacco history on file. She reports that she does not drink alcohol or use illicit drugs.  Allergies: No Known Allergies  No current facility-administered medications on file as of .   Medications Prior to Admission  Medication Sig Dispense Refill  . furosemide (LASIX) 20 MG tablet Take 20 mg by mouth 2 (two) times daily.        . metoprolol (LOPRESSOR) 50 MG tablet Take 50 mg by mouth 2 (two) times daily.          No results found for this or any previous visit (from the past 48 hour(s)). No results found.  Review of Systems  Constitutional: Negative.   HENT: Negative.   Eyes: Negative.   Respiratory: Negative.   Cardiovascular: Negative.   Gastrointestinal: Negative.   Genitourinary: Negative.   Musculoskeletal: Positive for joint pain (She had surgery of the left  ankle in November and had syndesmosis screw placed.  She is now  scheduled to have planned removal of the syndesmosis screw.  Her wounds have healed well from the first surgery.).  Skin: Negative.   Neurological: Negative.   Endo/Heme/Allergies: Negative.   Psychiatric/Behavioral: Negative.     There were no vitals taken for this visit. Physical Exam  Constitutional: She is oriented to person, place, and time. She appears well-developed and well-nourished.  HENT:  Head: Normocephalic and atraumatic.  Eyes: Pupils are equal, round, and reactive to light.  Neck: Normal range of motion. Neck supple.  Cardiovascular: Normal heart sounds and intact distal pulses.   Respiratory: Effort normal.  GI: Soft.  Musculoskeletal: She exhibits tenderness (left ankle with bilateral wounds well healed.).  Neurological: She is alert and oriented to person, place, and time. She has normal reflexes.  Skin: Skin is warm and dry.  Psychiatric: She has a normal mood and affect. Her behavior is normal. Judgment and thought content normal.     Assessment/Plan For removal of syndesmosis screw of the left lateral ankle.  Then, post surgery to begin physical therapy and weight bearing.  Gethsemane Fischler 10/17/2011, 2:16 PM

## 2011-10-18 ENCOUNTER — Ambulatory Visit (HOSPITAL_COMMUNITY): Payer: BC Managed Care – PPO

## 2011-10-18 ENCOUNTER — Encounter (HOSPITAL_COMMUNITY): Payer: Self-pay | Admitting: *Deleted

## 2011-10-18 ENCOUNTER — Ambulatory Visit (HOSPITAL_COMMUNITY)
Admission: RE | Admit: 2011-10-18 | Discharge: 2011-10-18 | Disposition: A | Discharge status: Home / Self Care | Payer: BC Managed Care – PPO | Source: Ambulatory Visit | Attending: Orthopaedic Surgery | Admitting: Orthopaedic Surgery

## 2011-10-18 ENCOUNTER — Encounter (HOSPITAL_COMMUNITY): Payer: Self-pay | Admitting: Anesthesiology

## 2011-10-18 ENCOUNTER — Encounter (HOSPITAL_COMMUNITY)
Admission: RE | Disposition: A | Discharge status: Home / Self Care | Payer: Self-pay | Source: Ambulatory Visit | Attending: Orthopaedic Surgery

## 2011-10-18 ENCOUNTER — Ambulatory Visit (HOSPITAL_COMMUNITY): Payer: BC Managed Care – PPO | Admitting: Anesthesiology

## 2011-10-18 DIAGNOSIS — Z79899 Other long term (current) drug therapy: Secondary | ICD-10-CM | POA: Insufficient documentation

## 2011-10-18 DIAGNOSIS — Z472 Encounter for removal of internal fixation device: Secondary | ICD-10-CM | POA: Insufficient documentation

## 2011-10-18 DIAGNOSIS — I10 Essential (primary) hypertension: Secondary | ICD-10-CM | POA: Insufficient documentation

## 2011-10-18 HISTORY — PX: HARDWARE REMOVAL: SHX979

## 2011-10-18 LAB — URINALYSIS, ROUTINE W REFLEX MICROSCOPIC
Hgb urine dipstick: NEGATIVE
Nitrite: NEGATIVE
Specific Gravity, Urine: 1.015 (ref 1.005–1.030)
Urobilinogen, UA: 0.2 mg/dL (ref 0.0–1.0)

## 2011-10-18 SURGERY — REMOVAL, HARDWARE
Anesthesia: General | Site: Ankle | Laterality: Left | Wound class: Clean

## 2011-10-18 MED ORDER — LISINOPRIL 20 MG PO TABS: 20.0000 mg | ORAL_TABLET | Freq: Every day | ORAL | Status: DC

## 2011-10-18 MED ORDER — MIDAZOLAM HCL 2 MG/2ML IJ SOLN
INTRAMUSCULAR | Status: AC
  Filled 2011-10-18: qty 2

## 2011-10-18 MED ORDER — SODIUM CHLORIDE 0.9 % IR SOLN
Status: DC | PRN
  Administered 2011-10-18: 1000 mL

## 2011-10-18 MED ORDER — MIDAZOLAM HCL 2 MG/2ML IJ SOLN
1.0000 mg | INTRAMUSCULAR | Status: DC | PRN
  Administered 2011-10-18: 2 mg via INTRAVENOUS

## 2011-10-18 MED ORDER — FENTANYL CITRATE 0.05 MG/ML IJ SOLN
INTRAMUSCULAR | Status: AC
  Administered 2011-10-18: 50 ug via INTRAVENOUS
  Filled 2011-10-18: qty 2

## 2011-10-18 MED ORDER — LIDOCAINE HCL (CARDIAC) 10 MG/ML IV SOLN
INTRAVENOUS | Status: DC | PRN
  Administered 2011-10-18: 10 mg via INTRAVENOUS

## 2011-10-18 MED ORDER — FUROSEMIDE 20 MG PO TABS: 20.0000 mg | ORAL_TABLET | Freq: Two times a day (BID) | ORAL | Status: DC

## 2011-10-18 MED ORDER — CEFAZOLIN SODIUM 1-5 GM-% IV SOLN
INTRAVENOUS | Status: AC
  Filled 2011-10-18: qty 50

## 2011-10-18 MED ORDER — PROPOFOL 10 MG/ML IV EMUL
INTRAVENOUS | Status: DC | PRN
  Administered 2011-10-18: 30 mg via INTRAVENOUS
  Administered 2011-10-18: 150 mg via INTRAVENOUS

## 2011-10-18 MED ORDER — FENTANYL CITRATE 0.05 MG/ML IJ SOLN
25.0000 ug | INTRAMUSCULAR | Status: DC | PRN
  Administered 2011-10-18 (×2): 50 ug via INTRAVENOUS

## 2011-10-18 MED ORDER — LACTATED RINGERS IV SOLN
INTRAVENOUS | Status: DC
  Administered 2011-10-18: 08:00:00 via INTRAVENOUS

## 2011-10-18 MED ORDER — LIDOCAINE HCL (PF) 1 % IJ SOLN
INTRAMUSCULAR | Status: AC
  Filled 2011-10-18: qty 5

## 2011-10-18 MED ORDER — CEFAZOLIN SODIUM 1-5 GM-% IV SOLN
1.0000 g | INTRAVENOUS | Status: AC
  Administered 2011-10-18: 1 g via INTRAVENOUS

## 2011-10-18 MED ORDER — PROPOFOL 10 MG/ML IV EMUL
INTRAVENOUS | Status: AC
  Filled 2011-10-18: qty 20

## 2011-10-18 MED ORDER — NAPROXEN SODIUM 220 MG PO TABS: 220.0000 mg | ORAL_TABLET | Freq: Two times a day (BID) | ORAL | Status: DC | PRN

## 2011-10-18 MED ORDER — FENTANYL CITRATE 0.05 MG/ML IJ SOLN
INTRAMUSCULAR | Status: DC | PRN
  Administered 2011-10-18: 25 ug via INTRAVENOUS
  Administered 2011-10-18: 50 ug via INTRAVENOUS
  Administered 2011-10-18: 25 ug via INTRAVENOUS

## 2011-10-18 MED ORDER — FLUTICASONE PROPIONATE 50 MCG/ACT NA SUSP: 1.0000 | Freq: Every day | NASAL | Status: DC

## 2011-10-18 MED ORDER — METOPROLOL TARTRATE 25 MG PO TABS: 50.0000 mg | ORAL_TABLET | Freq: Two times a day (BID) | ORAL | Status: DC

## 2011-10-18 MED ORDER — OXYCODONE-ACETAMINOPHEN 7.5-325 MG PO TABS: 1.0000 | ORAL_TABLET | ORAL | Status: DC | PRN

## 2011-10-18 MED ORDER — ONDANSETRON HCL 4 MG/2ML IJ SOLN: 4.0000 mg | Freq: Once | INTRAMUSCULAR | Status: DC | PRN

## 2011-10-18 MED ORDER — HYDROCODONE-ACETAMINOPHEN 7.5-325 MG PO TABS: 1.0000 | ORAL_TABLET | ORAL | Status: AC | PRN

## 2011-10-18 MED ORDER — ASPIRIN EC 81 MG PO TBEC: 81.0000 mg | DELAYED_RELEASE_TABLET | Freq: Every day | ORAL | Status: DC

## 2011-10-18 SURGICAL SUPPLY — 37 items
BAG HAMPER (MISCELLANEOUS) ×2 IMPLANT
BANDAGE ELASTIC 4 VELCRO NS (GAUZE/BANDAGES/DRESSINGS) ×2 IMPLANT
BANDAGE ELASTIC 6 VELCRO NS (GAUZE/BANDAGES/DRESSINGS) IMPLANT
BANDAGE ESMARK 4X12 BL STRL LF (DISPOSABLE) ×1 IMPLANT
BNDG COHESIVE 4X5 TAN NS LF (GAUZE/BANDAGES/DRESSINGS) ×2 IMPLANT
BNDG ESMARK 4X12 BLUE STRL LF (DISPOSABLE) ×2
CLOTH BEACON ORANGE TIMEOUT ST (SAFETY) ×2 IMPLANT
COVER LIGHT HANDLE STERIS (MISCELLANEOUS) ×4 IMPLANT
DRAPE C-ARM FOLDED MOBILE STRL (DRAPES) ×2 IMPLANT
DRSG XEROFORM 1X8 (GAUZE/BANDAGES/DRESSINGS) ×2 IMPLANT
ELECT REM PT RETURN 9FT ADLT (ELECTROSURGICAL) ×2
ELECTRODE REM PT RTRN 9FT ADLT (ELECTROSURGICAL) ×1 IMPLANT
GAUZE XEROFORM 5X9 LF (GAUZE/BANDAGES/DRESSINGS) IMPLANT
GLOVE BIO SURGEON STRL SZ8 (GLOVE) ×2 IMPLANT
GLOVE BIO SURGEON STRL SZ8.5 (GLOVE) ×2 IMPLANT
GLOVE BIOGEL PI IND STRL 7.0 (GLOVE) ×1 IMPLANT
GLOVE BIOGEL PI INDICATOR 7.0 (GLOVE) ×1
GLOVE EXAM NITRILE MD LF STRL (GLOVE) ×4 IMPLANT
GLOVE OPTIFIT SS 6.5 STRL BRWN (GLOVE) ×2 IMPLANT
GOWN STRL REIN XL XLG (GOWN DISPOSABLE) ×4 IMPLANT
INST SET MINOR BONE (KITS) ×2 IMPLANT
KIT ROOM TURNOVER APOR (KITS) ×2 IMPLANT
MANIFOLD NEPTUNE II (INSTRUMENTS) ×2 IMPLANT
NS IRRIG 1000ML POUR BTL (IV SOLUTION) ×2 IMPLANT
PACK BASIC LIMB (CUSTOM PROCEDURE TRAY) ×2 IMPLANT
PAD ABD 5X9 TENDERSORB (GAUZE/BANDAGES/DRESSINGS) IMPLANT
PAD CAST 4YDX4 CTTN HI CHSV (CAST SUPPLIES) IMPLANT
PADDING CAST COTTON 4X4 STRL (CAST SUPPLIES)
SET BASIN LINEN APH (SET/KITS/TRAYS/PACK) ×2 IMPLANT
SOL PREP PROV IODINE SCRUB 4OZ (MISCELLANEOUS) ×2 IMPLANT
SPONGE GAUZE 4X4 12PLY (GAUZE/BANDAGES/DRESSINGS) ×2 IMPLANT
SPONGE LAP 18X18 X RAY DECT (DISPOSABLE) ×4 IMPLANT
STAPLER VISISTAT 35W (STAPLE) ×2 IMPLANT
SUT CHROMIC VP 2 (SUTURE) ×2 IMPLANT
SUT ETHILON 3 0 FSL (SUTURE) ×2 IMPLANT
SUT PLAIN 2 0 XLH (SUTURE) IMPLANT
TAPE CLOTH SURG 4X10 WHT LF (GAUZE/BANDAGES/DRESSINGS) ×4 IMPLANT

## 2011-10-18 NOTE — Anesthesia Procedure Notes (Signed)
Procedure Name: LMA Insertion Date/Time: 10/18/2011 8:40 AM Performed by: Minerva Areola Pre-anesthesia Checklist: Patient identified, Patient being monitored, Emergency Drugs available, Timeout performed and Suction available Patient Re-evaluated:Patient Re-evaluated prior to inductionOxygen Delivery Method: Circle System Utilized Preoxygenation: Pre-oxygenation with 100% oxygen Intubation Type: IV induction Ventilation: Mask ventilation without difficulty LMA: LMA inserted LMA Size: 4.0 Number of attempts: 1 Placement Confirmation: positive ETCO2 and breath sounds checked- equal and bilateral

## 2011-10-18 NOTE — Anesthesia Postprocedure Evaluation (Signed)
Anesthesia Post Note  Patient: Tammy Farrell  Procedure(s) Performed:  HARDWARE REMOVAL - Removal Screw Left Ankle  Anesthesia type: General  Patient location: PACU  Post pain: Pain level controlled  Post assessment: Post-op Vital signs reviewed, Patient's Cardiovascular Status Stable, Respiratory Function Stable, Patent Airway, No signs of Nausea or vomiting and Pain level controlled  Last Vitals:  Filed Vitals:   10/18/11 0910  BP: 163/103  Pulse: 69  Temp: 36.6 C  Resp: 22    Post vital signs: Reviewed and stable  Level of consciousness: awake and alert   Complications: No apparent anesthesia complications

## 2011-10-18 NOTE — Transfer of Care (Signed)
Immediate Anesthesia Transfer of Care Note  Patient: Tammy Farrell  Procedure(s) Performed:  HARDWARE REMOVAL - Removal Screw Left Ankle  Patient Location: PACU  Anesthesia Type: General  Level of Consciousness: awake  Airway & Oxygen Therapy: Patient Spontanous Breathing and non-rebreather face mask  Post-op Assessment: Report given to PACU RN, Post -op Vital signs reviewed and stable and Patient moving all extremities  Post vital signs: Reviewed and stable  Complications: No apparent anesthesia complications

## 2011-10-18 NOTE — Progress Notes (Signed)
The History and Physical is unchanged. I have examined the patient. The patient is medically able to have surgery on the left ankle . Tammy Farrell 

## 2011-10-18 NOTE — Brief Op Note (Signed)
10/18/2011  9:08 AM  PATIENT:  Tammy Farrell  51 y.o. female  PRE-OPERATIVE DIAGNOSIS:  post ankle fracture left ankle with syndesmosis rupture, now for staged and planned removal of syndesmosis screw laterally  POST-OPERATIVE DIAGNOSIS:  post ankle fracture left  PROCEDURE:  Procedure(s): HARDWARE REMOVAL, removal of lateral syndesmosis screw left ankle  SURGEON:  Surgeon(s): [employer]  PHYSICIAN ASSISTANT:   ASSISTANTS: none   ANESTHESIA:   general  EBL:  Total I/O In: 500 [I.V.:500] Out: 0   BLOOD ADMINISTERED:none  DRAINS: none   LOCAL MEDICATIONS USED:  NONE  SPECIMEN:  No Specimen  DISPOSITION OF SPECIMEN:  N/A  COUNTS:  YES  TOURNIQUET:   Total Tourniquet Time Documented: Thigh (Left) - 3 minutes  DICTATION: .Other Dictation: Dictation Number [id number]  PLAN OF CARE: Discharge to home after PACU  PATIENT DISPOSITION:  PACU - hemodynamically stable.   Delay start of Pharmacological VTE agent (>24hrs) due to surgical blood loss or risk of bleeding:  n/a

## 2011-10-18 NOTE — Anesthesia Preprocedure Evaluation (Addendum)
Anesthesia Evaluation  Patient identified by MRN, date of birth, ID band Patient awake    Reviewed: Allergy & Precautions, H&P , NPO status , Patient's Chart, lab work & pertinent test results, reviewed documented beta blocker date and time   History of Anesthesia Complications Negative for: history of anesthetic complications  Airway Mallampati: II      Dental  (+) Teeth Intact   Pulmonary asthma (inhaler prn only) ,  clear to auscultation        Cardiovascular Exercise Tolerance: Good hypertension, Pt. on home beta blockers Regular Normal    Neuro/Psych Anxiety    GI/Hepatic   Endo/Other    Renal/GU Renal disease (polycystic kidneys)     Musculoskeletal   Abdominal (+) obese,   Peds  Hematology   Anesthesia Other Findings   Reproductive/Obstetrics                          Anesthesia Physical Anesthesia Plan  ASA: III  Anesthesia Plan: General   Post-op Pain Management:    Induction: Intravenous  Airway Management Planned: LMA  Additional Equipment:   Intra-op Plan:   Post-operative Plan: Extubation in OR  Informed Consent: I have reviewed the patients History and Physical, chart, labs and discussed the procedure including the risks, benefits and alternatives for the proposed anesthesia with the patient or authorized representative who has indicated his/her understanding and acceptance.     Plan Discussed with:   Anesthesia Plan Comments:         Anesthesia Quick Evaluation

## 2011-10-19 NOTE — Op Note (Signed)
NAMEMINNAH, Tammy Farrell                ACCOUNT NO.:  [id number]  MEDICAL RECORD NO.:  [id number]  LOCATION:  [facility]                          FACILITY:  [facility]  PHYSICIAN:  J. Darreld Mclean, M.D. DATE OF BIRTH:  18-Aug-1961  DATE OF PROCEDURE: DATE OF DISCHARGE:  10/18/2011                              OPERATIVE REPORT   PREOPERATIVE DIAGNOSIS:  Status post fracture dislocation of the left ankle, status post open treatment internal reduction now for a planned stage removal of syndesmosis screw of the ankle.  POSTOPERATIVE DIAGNOSIS:  Status post fracture dislocation of the left ankle, status post open treatment internal reduction now for a planned stage removal of syndesmosis screw of the ankle.  PROCEDURE:  Removal of hardware, left syndesmosis screw laterally of the left ankle.  ANESTHESIA:  General.  TOURNIQUET TIME:  3 minutes.  DRAINS:  No drains.  SURGEON:  J. Darreld Mclean, M.D.  INDICATIONS:  In November, the patient fell and had severe fracture dislocation of her left ankle.  She had rupture of the syndesmosis, ankle was reduced.  During surgery, syndesmosis screw was placed.  The patient was told that she could not put weight on it,until the syndesmosis screw has been removed and this has been a planned removal of the screw. She understands risks and imponderables of the procedure.  DESCRIPTION OF PROCEDURE:  The patient was seen in the holding area. The left ankle was identified as correct surgical site.  Marks placed on left ankle by me and the patient.  She was brought to the operating room and given general anesthesia while supine.  Tourniquet placed, deflated left upper thigh.  The patient prepped and draped in usual manner.  We had a time-out identifying the patient as Tammy Farrell, identifying and we are doing left ankle removal of the screw.  All instrumentation was properly positioned to work, and OR Team knew each other.  C-arm fluoroscopy unit was then placed with  a time-out for the Radiology. Everyone had Apron worn and badges and proper shielding.  C-arm fluoroscopy unit was brought in.  The area where the screw was located and a small mark made on the skin.  Tourniquet was elevated to 350 mmHg.  A small incision was made directly over the screw site with careful dissection, screw head was identified, I removed this with a screwdriver.  The patient will be given the screw later after it has been sterilized.  The wound then reapproximated using 2-0 chromic and skin staples.  Tourniquet deflated after 3 minutes.  Sterile dressing applied.  The patient tolerated procedure well.  She will be given Norco for pain.  I will see her in the office in approximately 10 days.  She is to begin some gentle weightbearing on it.  Any difficulties, contact me through the office hospital beeper system.          ______________________________ Shela Commons. Darreld Mclean, M.D.     JWK/MEDQ  D:  10/18/2011  T:  10/19/2011  Job:  [id number]

## 2011-10-21 ENCOUNTER — Encounter (HOSPITAL_COMMUNITY): Payer: Self-pay | Admitting: Orthopaedic Surgery

## 2012-09-26 ENCOUNTER — Emergency Department (HOSPITAL_COMMUNITY)
Admission: EM | Admit: 2012-09-26 | Discharge: 2012-09-26 | Disposition: A | Discharge status: Home / Self Care | Payer: BC Managed Care – PPO | Attending: Emergency Medicine | Admitting: Emergency Medicine

## 2012-09-26 ENCOUNTER — Other Ambulatory Visit: Payer: Self-pay

## 2012-09-26 ENCOUNTER — Emergency Department (HOSPITAL_COMMUNITY): Payer: BC Managed Care – PPO

## 2012-09-26 ENCOUNTER — Encounter (HOSPITAL_COMMUNITY): Payer: Self-pay | Admitting: Emergency Medicine

## 2012-09-26 DIAGNOSIS — I129 Hypertensive chronic kidney disease with stage 1 through stage 4 chronic kidney disease, or unspecified chronic kidney disease: Secondary | ICD-10-CM | POA: Insufficient documentation

## 2012-09-26 DIAGNOSIS — N189 Chronic kidney disease, unspecified: Secondary | ICD-10-CM | POA: Insufficient documentation

## 2012-09-26 DIAGNOSIS — J45901 Unspecified asthma with (acute) exacerbation: Secondary | ICD-10-CM | POA: Insufficient documentation

## 2012-09-26 DIAGNOSIS — Z8669 Personal history of other diseases of the nervous system and sense organs: Secondary | ICD-10-CM | POA: Insufficient documentation

## 2012-09-26 DIAGNOSIS — E785 Hyperlipidemia, unspecified: Secondary | ICD-10-CM | POA: Insufficient documentation

## 2012-09-26 DIAGNOSIS — Z8659 Personal history of other mental and behavioral disorders: Secondary | ICD-10-CM | POA: Insufficient documentation

## 2012-09-26 DIAGNOSIS — Z7982 Long term (current) use of aspirin: Secondary | ICD-10-CM | POA: Insufficient documentation

## 2012-09-26 DIAGNOSIS — R079 Chest pain, unspecified: Secondary | ICD-10-CM | POA: Insufficient documentation

## 2012-09-26 DIAGNOSIS — Z79899 Other long term (current) drug therapy: Secondary | ICD-10-CM | POA: Insufficient documentation

## 2012-09-26 LAB — CBC WITH DIFFERENTIAL/PLATELET
Eosinophils Absolute: 0 10*3/uL (ref 0.0–0.7)
HCT: 43.9 % (ref 36.0–46.0)
Hemoglobin: 14.6 g/dL (ref 12.0–15.0)
Lymphs Abs: 1.9 10*3/uL (ref 0.7–4.0)
MCH: 28.5 pg (ref 26.0–34.0)
Monocytes Absolute: 0.4 10*3/uL (ref 0.1–1.0)
Monocytes Relative: 3 % (ref 3–12)
Neutrophils Relative %: 84 % — ABNORMAL HIGH (ref 43–77)
RBC: 5.12 MIL/uL — ABNORMAL HIGH (ref 3.87–5.11)

## 2012-09-26 LAB — BASIC METABOLIC PANEL
BUN: 27 mg/dL — ABNORMAL HIGH (ref 6–23)
Chloride: 100 mEq/L (ref 96–112)
Creatinine, Ser: 2.26 mg/dL — ABNORMAL HIGH (ref 0.50–1.10)
GFR calc non Af Amer: 24 mL/min — ABNORMAL LOW (ref >90)
Glucose, Bld: 116 mg/dL — ABNORMAL HIGH (ref 70–99)
Potassium: 4.3 mEq/L (ref 3.5–5.1)

## 2012-09-26 NOTE — ED Notes (Signed)
Pt c/o onset of chest pain yesterday, reports tightness with tingling radiation into L arm. Pt reports emesis x 1 this am. Pt reports episode of assoc diaphoresis.

## 2012-09-26 NOTE — ED Notes (Signed)
Pt reports productive cough with greenish colored sputum. Seen by PCP for cough yesterday.

## 2012-09-26 NOTE — ED Provider Notes (Signed)
History     CSN: [id number]  Arrival date & time 09/26/12  1247   First MD Initiated Contact with Patient 09/26/12 1251      Chief Complaint  Patient presents with  . Chest Pain    (Consider location/radiation/quality/duration/timing/severity/associated sxs/prior treatment) HPI....tightness in chest for the past 24 hours intermittently without radiation. She reports an asthma attack this morning. Mild dyspnea. Nothing makes symptoms better or worse. Severity is mild. No previous cardiac problems. No family history. Nonsmoker.  Recent stress test by Dr. Oletta Darter cardiology was normal.  Past Medical History  Diagnosis Date  . Hypertension   . Hyperlipidemia   . Chronic kidney disease     Polycystic Kidney Disease  . Asthma   . Seizures     x1 many years ago when pt was being pre-oped for another surgery  . Anxiety     Past Surgical History  Procedure Date  . Abdominal hysterectomy   . Wrist fracture surgery left  . Orif ankle fracture 08/18/2011    Procedure: OPEN REDUCTION INTERNAL FIXATION (ORIF) ANKLE FRACTURE;  Surgeon: Darreld Mclean;  Location: [facility];  Service: Orthopedics;  Laterality: Left;  . Hardware removal 10/18/2011    Procedure: HARDWARE REMOVAL;  Surgeon: Darreld Mclean;  Location: [facility];  Service: Orthopedics;  Laterality: Left;  Removal Screw Left Ankle    Family History  Problem Relation Age of Onset  . Anesthesia problems Neg Hx   . Hypotension Neg Hx   . Malignant hyperthermia Neg Hx   . Pseudochol deficiency Neg Hx   . Heart failure Mother   . Stroke Other     History  Substance Use Topics  . Smoking status: Never Smoker   . Smokeless tobacco: Not on file  . Alcohol Use: No    OB History    Grav Para Term Preterm Abortions TAB SAB Ect Mult Living   2 2 2              Review of Systems  All other systems reviewed and are negative.    Allergies  Review of patient's allergies indicates no known allergies.  Home Medications    Current Outpatient Rx  Name  Route  Sig  Dispense  Refill  . ASPIRIN EC 81 MG PO TBEC   Oral   Take 81 mg by mouth daily.           . FUROSEMIDE 20 MG PO TABS   Oral   Take 20 mg by mouth 2 (two) times daily.           Marland Kitchen LISINOPRIL 20 MG PO TABS   Oral   Take 20 mg by mouth daily.           Marland Kitchen METOPROLOL TARTRATE 50 MG PO TABS   Oral   Take 50 mg by mouth 2 (two) times daily.           . MOMETASONE FUROATE 50 MCG/ACT NA SUSP   Nasal   Place 2 sprays into the nose daily as needed. Congestion/Allergies          . NAPROXEN SODIUM 220 MG PO TABS   Oral   Take 220 mg by mouth 2 (two) times daily as needed.           . OXYCODONE-ACETAMINOPHEN 7.5-325 MG PO TABS   Oral   Take 1 tablet by mouth every 4 (four) hours as needed. Pain            BP  174/110  Pulse 82  Resp 20  Ht 5\' 3"  (1.6 m)  Wt 252 lb (114.306 kg)  BMI 44.64 kg/m2  SpO2 100%  Physical Exam  Nursing note and vitals reviewed. Constitutional: She is oriented to person, place, and time. She appears well-developed and well-nourished.  HENT:  Head: Normocephalic and atraumatic.  Eyes: Conjunctivae normal and EOM are normal. Pupils are equal, round, and reactive to light.  Neck: Normal range of motion. Neck supple.  Cardiovascular: Normal rate, regular rhythm and normal heart sounds.   Pulmonary/Chest: Effort normal and breath sounds normal.  Abdominal: Soft. Bowel sounds are normal.  Musculoskeletal: Normal range of motion.  Neurological: She is alert and oriented to person, place, and time.  Skin: Skin is warm and dry.  Psychiatric: She has a normal mood and affect.    ED Course  Procedures (including critical care time)  Labs Reviewed  CBC WITH DIFFERENTIAL - Abnormal; Notable for the following:    WBC 14.9 (*)     RBC 5.12 (*)     Neutrophils Relative 84 (*)     Neutro Abs 12.6 (*)     All other components within normal limits  BASIC METABOLIC PANEL - Abnormal; Notable for the  following:    Glucose, Bld 116 (*)     BUN 27 (*)     Creatinine, Ser 2.26 (*)     Calcium 10.8 (*)     GFR calc non Af Amer 24 (*)     GFR calc Af Amer 28 (*)     All other components within normal limits  TROPONIN I   Dg Chest Port 1 View  09/26/2012  *RADIOLOGY REPORT*  Clinical Data: Chest pain, hypertension, asthma, chronic kidney disease  PORTABLE CHEST - 1 VIEW  Comparison: Portable exam 1305 hours compared to 02/11/2008  Findings: Enlargement of cardiac silhouette. Mediastinal contours and pulmonary vascularity normal. Lungs clear. No pleural effusion or pneumothorax.  IMPRESSION: Mild enlargement of cardiac silhouette. No acute abnormalities.   Original Report Authenticated By: Ulyses Southward, M.D.     Date: 09/26/2012  Rate: 85  Rhythm: normal sinus rhythm  QRS Axis: normal  Intervals: normal  ST/T Wave abnormalities: normal  Conduction Disutrbances: none  Narrative Interpretation: unremarkable     No diagnosis found.    MDM  HEENT tests including troponin, EKG, chest x-ray showed no acute changes.  He recommended followup with primary care, cardiology, and nephrologist for mildly elevated creatinine.  Patient feels better and is stable at discharge        Donnetta Hutching, MD 09/26/12 1517

## 2012-09-26 NOTE — ED Notes (Signed)
Pt given discharge instructions. Follow-up care stressed with pt for HTN and kidney function. Pt verbalized understanding.

## 2012-10-28 IMAGING — RF DG ANKLE 2V *L*
1 series · 1 of 1 positions shown · non-contrast
Comparison: August 18, 2011

CLINICAL DATA: Left ankle screw removal, fracture

LEFT ANKLE - 2 VIEW

[Series 1: run · 1 of 1 slices shown]
[im 1/1]
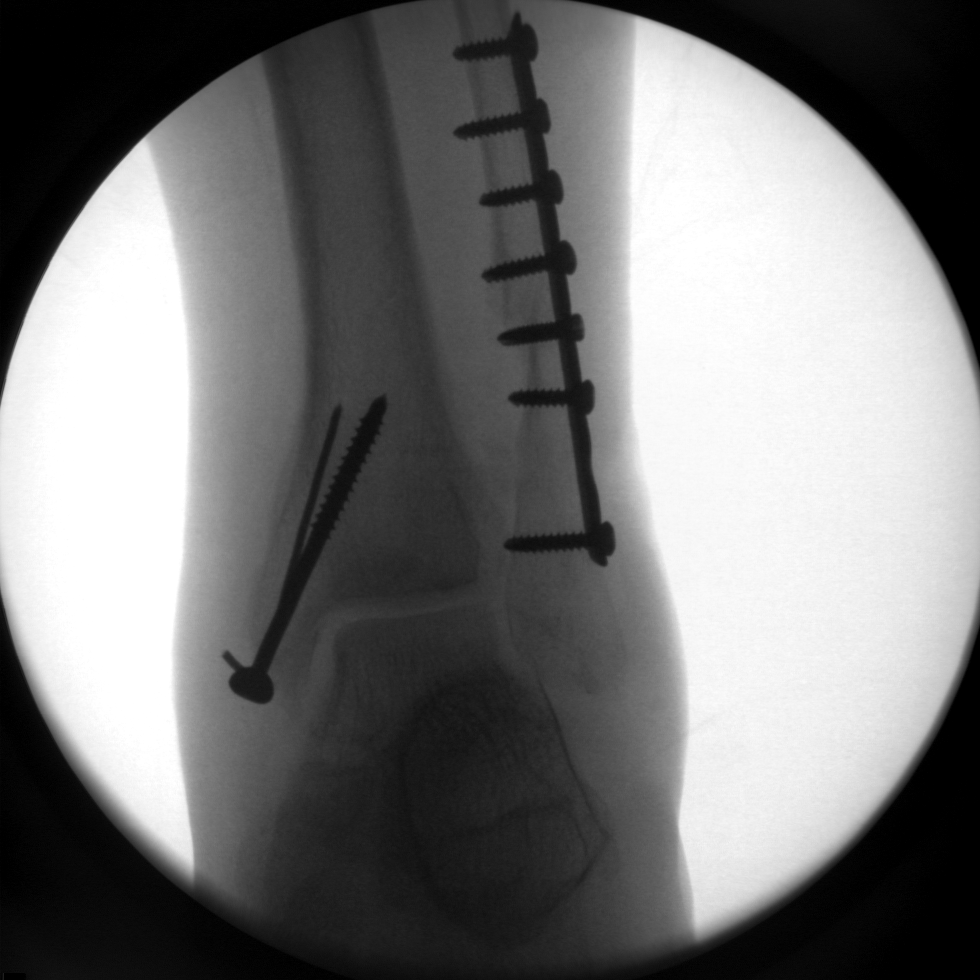

[1 of 1 positions shown; findings below may reference images not displayed]

FINDINGS: Two medial malleolar screws are unchanged in position.
There is side plate and screw fixation of the distal fibular
fracture. The seventh fibular screw which extended to the tibia has
been removed.  No new fracture.  Anatomic is alignment.  The ankle
mortise is intact.
IMPRESSION: No change in appearance of bimalleolar fractures/alignment .

## 2012-12-31 ENCOUNTER — Encounter (HOSPITAL_COMMUNITY): Payer: Self-pay

## 2012-12-31 ENCOUNTER — Emergency Department (HOSPITAL_COMMUNITY): Payer: BC Managed Care – PPO

## 2012-12-31 ENCOUNTER — Emergency Department (HOSPITAL_COMMUNITY)
Admission: EM | Admit: 2012-12-31 | Discharge: 2013-01-01 | Disposition: A | Discharge status: Home / Self Care | Payer: BC Managed Care – PPO | Attending: Emergency Medicine | Admitting: Emergency Medicine

## 2012-12-31 DIAGNOSIS — F411 Generalized anxiety disorder: Secondary | ICD-10-CM | POA: Insufficient documentation

## 2012-12-31 DIAGNOSIS — Z79899 Other long term (current) drug therapy: Secondary | ICD-10-CM | POA: Insufficient documentation

## 2012-12-31 DIAGNOSIS — I129 Hypertensive chronic kidney disease with stage 1 through stage 4 chronic kidney disease, or unspecified chronic kidney disease: Secondary | ICD-10-CM | POA: Insufficient documentation

## 2012-12-31 DIAGNOSIS — G40909 Epilepsy, unspecified, not intractable, without status epilepticus: Secondary | ICD-10-CM | POA: Insufficient documentation

## 2012-12-31 DIAGNOSIS — IMO0001 Reserved for inherently not codable concepts without codable children: Secondary | ICD-10-CM | POA: Insufficient documentation

## 2012-12-31 DIAGNOSIS — M62838 Other muscle spasm: Secondary | ICD-10-CM | POA: Insufficient documentation

## 2012-12-31 DIAGNOSIS — N189 Chronic kidney disease, unspecified: Secondary | ICD-10-CM | POA: Insufficient documentation

## 2012-12-31 DIAGNOSIS — J45909 Unspecified asthma, uncomplicated: Secondary | ICD-10-CM | POA: Insufficient documentation

## 2012-12-31 DIAGNOSIS — E785 Hyperlipidemia, unspecified: Secondary | ICD-10-CM | POA: Insufficient documentation

## 2012-12-31 DIAGNOSIS — Z7982 Long term (current) use of aspirin: Secondary | ICD-10-CM | POA: Insufficient documentation

## 2012-12-31 LAB — BASIC METABOLIC PANEL
BUN: 30 mg/dL — ABNORMAL HIGH (ref 6–23)
CO2: 22 mEq/L (ref 19–32)
Chloride: 101 mEq/L (ref 96–112)
Creatinine, Ser: 2.13 mg/dL — ABNORMAL HIGH (ref 0.50–1.10)
GFR calc Af Amer: 30 mL/min — ABNORMAL LOW (ref >90)
Potassium: 3.6 mEq/L (ref 3.5–5.1)

## 2012-12-31 MED ORDER — DIAZEPAM 5 MG PO TABS
5.0000 mg | ORAL_TABLET | Freq: Once | ORAL | Status: AC
  Administered 2012-12-31: 5 mg via ORAL
  Filled 2012-12-31: qty 1

## 2012-12-31 NOTE — ED Notes (Signed)
Pt states that she has right foot pain since her right foot started cramping earlier this evening, denies cramping at this time, states unable to put weight on right foot

## 2012-12-31 NOTE — ED Notes (Signed)
J. Idol, PA at bedside. 

## 2012-12-31 NOTE — ED Notes (Signed)
Patient complaining of right leg cramping. Started this evening per pt.

## 2013-01-01 MED ORDER — DIAZEPAM 5 MG PO TABS: 5.0000 mg | ORAL_TABLET | Freq: Three times a day (TID) | ORAL | Status: DC | PRN

## 2013-01-01 NOTE — ED Provider Notes (Signed)
Medical screening examination/treatment/procedure(s) were performed by non-physician practitioner and as supervising physician I was immediately available for consultation/collaboration.   Shelda Jakes, MD 01/01/13 2025

## 2013-01-01 NOTE — ED Provider Notes (Signed)
History     CSN: [id number]  Arrival date & time 12/31/12  2156   First MD Initiated Contact with Patient 12/31/12 2226      Chief Complaint  Patient presents with  . Leg Pain    (Consider location/radiation/quality/duration/timing/severity/associated sxs/prior treatment) HPI Comments: Tammy Farrell is a 52 y.o. Female presenting with muscle spasm in her right foot which started shortly before arrival here,  As she was taking off her shoe after standing on a concrete floor all day with her job.  She denies radiation of pain or spasm, it is isolated to the dorsum of her right foot.  She denies history of problems with muscle spasm and denies trauma. She has been unable to bear weight on her foot tonight.  She takes lasix for control of hypertension.     The history is provided by the patient.    Past Medical History  Diagnosis Date  . Hypertension   . Hyperlipidemia   . Chronic kidney disease     Polycystic Kidney Disease  . Asthma   . Seizures     x1 many years ago when pt was being pre-oped for another surgery  . Anxiety     Past Surgical History  Procedure Laterality Date  . Abdominal hysterectomy    . Wrist fracture surgery  left  . Orif ankle fracture  08/18/2011    Procedure: OPEN REDUCTION INTERNAL FIXATION (ORIF) ANKLE FRACTURE;  Surgeon: Darreld Mclean;  Location: [facility];  Service: Orthopedics;  Laterality: Left;  . Hardware removal  10/18/2011    Procedure: HARDWARE REMOVAL;  Surgeon: Darreld Mclean;  Location: [facility];  Service: Orthopedics;  Laterality: Left;  Removal Screw Left Ankle    Family History  Problem Relation Age of Onset  . Anesthesia problems Neg Hx   . Hypotension Neg Hx   . Malignant hyperthermia Neg Hx   . Pseudochol deficiency Neg Hx   . Heart failure Mother   . Stroke Other     History  Substance Use Topics  . Smoking status: Never Smoker   . Smokeless tobacco: Not on file  . Alcohol Use: No    OB History   Grav Para Term Preterm  Abortions TAB SAB Ect Mult Living   2 2 2              Review of Systems  Constitutional: Negative for fever.  Musculoskeletal: Positive for myalgias and arthralgias. Negative for joint swelling.  Skin: Negative for rash and wound.  Neurological: Negative for weakness and numbness.    Allergies  Review of patient's allergies indicates no known allergies.  Home Medications   Current Outpatient Rx  Name  Route  Sig  Dispense  Refill  . albuterol (PROVENTIL HFA;VENTOLIN HFA) 108 (90 BASE) MCG/ACT inhaler   Inhalation   Inhale 2 puffs into the lungs every 6 (six) hours as needed for wheezing or shortness of breath.         Marland Kitchen aspirin EC 81 MG tablet   Oral   Take 81 mg by mouth daily.           . furosemide (LASIX) 20 MG tablet   Oral   Take 20 mg by mouth 2 (two) times daily.           Marland Kitchen lisinopril (PRINIVIL,ZESTRIL) 20 MG tablet   Oral   Take 20 mg by mouth daily.           . metoprolol (LOPRESSOR) 50 MG tablet  Oral   Take 100 mg by mouth every morning.          Marland Kitchen oxyCODONE-acetaminophen (PERCOCET) 7.5-325 MG per tablet   Oral   Take 1 tablet by mouth every 4 (four) hours as needed. Pain         . diazepam (VALIUM) 5 MG tablet   Oral   Take 1 tablet (5 mg total) by mouth every 8 (eight) hours as needed (muscle spasm).   10 tablet   0   . naproxen sodium (ALEVE) 220 MG tablet   Oral   Take 220 mg by mouth 2 (two) times daily as needed (for pain).            BP 185/102  Pulse 73  Temp(Src) 97.9 F (36.6 C) (Oral)  Resp 18  Ht 5\' 3"  (1.6 m)  Wt 252 lb (114.306 kg)  BMI 44.65 kg/m2  SpO2 98%  Physical Exam  Constitutional: She appears well-developed and well-nourished.  HENT:  Head: Atraumatic.  Neck: Normal range of motion.  Cardiovascular:  Pulses equal bilaterally  Musculoskeletal: She exhibits tenderness. She exhibits no edema.       Right foot: She exhibits tenderness. She exhibits normal range of motion, no bony tenderness, no  swelling, normal capillary refill and no crepitus.  TTP across right foot dorsum. Less than 3 sec cap refill in toes.  Full dorsalis pedis pulse.  No achilles or calf tenderness.  Skin appears healthy,  No rash, abrasions or wounds, no edema.  No appreciable palpable muscle spasm.  Neurological: She is alert. She has normal strength. She displays normal reflexes. No sensory deficit.  Equal strength  Skin: Skin is warm and dry.  Psychiatric: She has a normal mood and affect.    ED Course  Procedures (including critical care time)  Labs Reviewed  BASIC METABOLIC PANEL - Abnormal; Notable for the following:    Glucose, Bld 105 (*)    BUN 30 (*)    Creatinine, Ser 2.13 (*)    GFR calc non Af Amer 26 (*)    GFR calc Af Amer 30 (*)    All other components within normal limits   Dg Foot Complete Right  12/31/2012  *RADIOLOGY REPORT*  Clinical Data: Right foot pain.  Right leg pain.  RIGHT FOOT COMPLETE - 3+ VIEW  Comparison: None.  Findings: Anatomic alignment the right foot.  No fracture.  Soft tissues are normal.  Calcaneal spurs incidentally noted.  IMPRESSION: No acute osseous abnormality.   Original Report Authenticated By: Andreas Newport, M.D.      1. Muscle spasms of lower extremity       MDM  Muscle spasm in right dorsal foot,  Improved after tx with valium.    Patients labs and/or radiological studies were viewed and considered during the medical decision making and disposition process. No  Metabolic reason for muscle spasm found,  Suspect simple isolated spasm,  Possibly from overuse/ walking all day.  Short course of valium prescribed,  Heat pad may be helpful. Recheck by pcp if not improving over the next several days.        Burgess Amor, PA-C 01/01/13 [phone]

## 2013-10-17 ENCOUNTER — Ambulatory Visit (INDEPENDENT_AMBULATORY_CARE_PROVIDER_SITE_OTHER): Payer: BC Managed Care – PPO | Admitting: Cardiovascular Disease

## 2013-10-17 ENCOUNTER — Encounter: Payer: Self-pay | Admitting: Cardiovascular Disease

## 2013-10-17 VITALS — BP 152/90 | HR 64 | Ht 63.0 in | Wt 259.7 lb

## 2013-10-17 DIAGNOSIS — Q613 Polycystic kidney, unspecified: Secondary | ICD-10-CM

## 2013-10-17 DIAGNOSIS — J45909 Unspecified asthma, uncomplicated: Secondary | ICD-10-CM

## 2013-10-17 DIAGNOSIS — I119 Hypertensive heart disease without heart failure: Secondary | ICD-10-CM

## 2013-10-17 DIAGNOSIS — E785 Hyperlipidemia, unspecified: Secondary | ICD-10-CM | POA: Insufficient documentation

## 2013-10-17 DIAGNOSIS — I10 Essential (primary) hypertension: Secondary | ICD-10-CM | POA: Insufficient documentation

## 2013-10-17 MED ORDER — NEBIVOLOL HCL 10 MG PO TABS: 10.0000 mg | ORAL_TABLET | Freq: Every day | ORAL | Status: AC

## 2013-10-17 MED ORDER — AMLODIPINE BESYLATE 5 MG PO TABS: 5.0000 mg | ORAL_TABLET | Freq: Every day | ORAL | Status: AC

## 2013-10-17 NOTE — Patient Instructions (Addendum)
Your physician recommends that you schedule a follow-up appointment in: 2 months.  Your physician recommends that you return for lab work fasting.    Your physician has recommended you make the following change in your medication: start new prescription given for amlodipine this has already been sent to the pharmacy.

## 2013-10-17 NOTE — Progress Notes (Signed)
Patient ID: BELLA BRUMMET, female   DOB: 03-14-1961, 53 y.o.   MRN: [id number]     HPI: DANAIJA ESKRIDGE is a 53 y.o. female who presents to the office today for one-year cardiology evaluation.  Ms. Schmutz is a 53 year old African American female who has a history of obesity, polycystic kidney disease, hypertension, asthma, as well as hyperlipidemia. In 2013 when I had seen her she had run out of her medications and presented with stage II hypertension blood pressure 205/100. When I last saw her one year ago her blood pressure was improved but still elevated. At that time I titrated her Bystolic 10 mg and amlodipine 5 mg per day recommended a followup evaluation in 2 months but she never returned for this. She states she had been taking the medication. However, approximately 3 months ago she ran out of her amlodipine and never bothered having this refilled. She presents to the office today for evaluation.  She denies recent chest pain. She denies palpitations. She does note some mild left ankle swelling and did sustain a fracture to that left ankle 2 years ago. She denies any significant weight loss. She presents for evaluation.  Past Medical History  Diagnosis Date  . Hypertension   . Hyperlipidemia   . Chronic kidney disease     Polycystic Kidney Disease  . Asthma   . Seizures     x1 many years ago when pt was being pre-oped for another surgery  . Anxiety     Past Surgical History  Procedure Laterality Date  . Abdominal hysterectomy    . Wrist fracture surgery  left  . Orif ankle fracture  08/18/2011    Procedure: OPEN REDUCTION INTERNAL FIXATION (ORIF) ANKLE FRACTURE;  Surgeon: Darreld Mclean;  Location: [facility];  Service: Orthopedics;  Laterality: Left;  . Hardware removal  10/18/2011    Procedure: HARDWARE REMOVAL;  Surgeon: Darreld Mclean;  Location: [facility];  Service: Orthopedics;  Laterality: Left;  Removal Screw Left Ankle    No Known Allergies  Current Outpatient Prescriptions    Medication Sig Dispense Refill  . albuterol (PROVENTIL HFA;VENTOLIN HFA) 108 (90 BASE) MCG/ACT inhaler Inhale 2 puffs into the lungs every 6 (six) hours as needed for wheezing or shortness of breath.      Marland Kitchen aspirin EC 81 MG tablet Take 81 mg by mouth daily.        . diazepam (VALIUM) 5 MG tablet Take 1 tablet (5 mg total) by mouth every 8 (eight) hours as needed (muscle spasm).  10 tablet  0  . naproxen sodium (ALEVE) 220 MG tablet Take 220 mg by mouth 2 (two) times daily as needed (for pain).       . nebivolol (BYSTOLIC) 10 MG tablet Take 10 mg by mouth daily.       No current facility-administered medications for this visit.    History   Social History  . Marital Status: Married    Spouse Name: N/A    Number of Children: N/A  . Years of Education: N/A   Occupational History  . Not on file.   Social History Main Topics  . Smoking status: Never Smoker   . Smokeless tobacco: Not on file  . Alcohol Use: No  . Drug Use: No  . Sexual Activity: Not on file   Other Topics Concern  . Not on file   Social History Narrative  . No narrative on file   Socially she's married has 2 children. No tobacco  or alcohol use.  Family History  Problem Relation Age of Onset  . Anesthesia problems Neg Hx   . Hypotension Neg Hx   . Malignant hyperthermia Neg Hx   . Pseudochol deficiency Neg Hx   . Heart failure Mother   . Stroke Other     ROS is negative for fevers, chills or night sweats. She denies change in vision. She denies change in hearing. Throat no headaches. She denies lymphadenopathy. She denies significant musculoskeletal symptoms. There is a mild shortness of breath with activity. She denies chest pressure. She is unaware of palpitations. At times she notes a sharp catch in the left side. She denies nausea vomiting diarrhea. There is no blood in stool or urine. She denies claudication. She does note some mild left ankle swelling. She denies rash. There is no seizures.  Other  comprehensive 14 point system review is negative.  PE BP 152/90  Pulse 64  Ht 5\' 3"  (1.6 m)  Wt 259 lb 11.2 oz (117.799 kg)  BMI 46.02 kg/m2  General: Alert, oriented, no distress.; Morbidly obese Skin: normal turgor, no rashes HEENT: Normocephalic, atraumatic. Pupils round and reactive; sclera anicteric;no lid lag. Extraocular muscles intact. Nose without nasal septal hypertrophy Mouth/Parynx benign; Mallinpatti scale 3 Neck: No JVD, no carotid bruits; normal carotid upstroke Lungs: clear to ausculatation and percussion; no wheezing or rales Chest wall: no tenderness to palpitation Heart: RRR, s1 s2 normal 1/6 systolic murmur. Abdomen: soft, nontender; no hepatosplenomehaly, BS+; abdominal aorta nontender and not dilated by palpation. Back: no CVA tenderness Pulses 2+ Extremities: Trace left ankle edema; no clubbing cyanosis, Homan's sign negative  Neurologic: grossly nonfocal; cranial nerves grossly normal. Psychologic: normal affect and mood.  ECG (independently read by me): Sinus rhythm. Poor R wave progression; inferior Q waves, old  LABS:  BMET    Component Value Date/Time   NA 135 12/31/2012 2300   K 3.6 12/31/2012 2300   CL 101 12/31/2012 2300   CO2 22 12/31/2012 2300   GLUCOSE 105* 12/31/2012 2300   BUN 30* 12/31/2012 2300   CREATININE 2.13* 12/31/2012 2300   CALCIUM 9.3 12/31/2012 2300   GFRNONAA 26* 12/31/2012 2300   GFRAA 30* 12/31/2012 2300     Hepatic Function Panel     Component Value Date/Time   PROT 7.2 10/13/2011 1219   ALBUMIN 3.5 10/13/2011 1219   AST 13 10/13/2011 1219   ALT 16 10/13/2011 1219   ALKPHOS 128* 10/13/2011 1219   BILITOT 0.4 10/13/2011 1219     CBC    Component Value Date/Time   WBC 14.9* 09/26/2012 1152   RBC 5.12* 09/26/2012 1152   HGB 14.6 09/26/2012 1152   HCT 43.9 09/26/2012 1152   PLT 297 09/26/2012 1152   MCV 85.7 09/26/2012 1152   MCH 28.5 09/26/2012 1152   MCHC 33.3 09/26/2012 1152   RDW 13.9 09/26/2012 1152   LYMPHSABS 1.9  09/26/2012 1152   MONOABS 0.4 09/26/2012 1152   EOSABS 0.0 09/26/2012 1152   BASOSABS 0.0 09/26/2012 1152     BNP No results found for this basename: probnp    Lipid Panel  No results found for this basename: chol, trig, hdl, cholhdl, vldl, ldlcalc     RADIOLOGY: No results found.    ASSESSMENT AND PLAN:  Ms. Carolin CoyRoach is a 53 year old female who has morbid obesity. Apparently she has run out of her amlodipine for several months. Her blood pressure today is elevated on repeat by me was 160/94. I am recommending reinstitution  of amlodipine at 5 mg to add to her current dose of Bystolic 10 mg. I am recommending followup laboratory be obtained in 2-3 weeks and I will make additional adjustments to her medications pending above laboratory results concerning her hypertension, kidney disease, and lipids.. An echo Doppler study done in August 2013 showed mild-to-moderate concentric left ventricular hypertrophy with normal systolic function and evidence for diastolic dysfunction. She had normal wall motion. She's not had any chest pain. A prior nuclear study has suggested mild inferior defect. She's not having anginal symptoms. I will see her back in the office in 6-8 weeks for followup evaluation at that time additional adjustments to her medical regimen may be necessary if she's not optimally treated with reference to blood pressure control. We did discuss importance of weight loss.    Lennette Bihari, MD, [facility]  10/17/2013 5:12 PM

## 2013-10-22 ENCOUNTER — Encounter: Payer: Self-pay | Admitting: Cardiovascular Disease

## 2013-10-31 LAB — LIPID PANEL
Cholesterol: 268 mg/dL — ABNORMAL HIGH (ref 0–200)
HDL: 49 mg/dL (ref >39)
LDL CALC: 197 mg/dL — AB (ref 0–99)
TRIGLYCERIDES: 111 mg/dL (ref <150)
Total CHOL/HDL Ratio: 5.5 Ratio
VLDL: 22 mg/dL (ref 0–40)

## 2013-10-31 LAB — COMPREHENSIVE METABOLIC PANEL
ALBUMIN: 4 g/dL (ref 3.5–5.2)
ALT: 21 U/L (ref 0–35)
AST: 18 U/L (ref 0–37)
Alkaline Phosphatase: 116 U/L (ref 39–117)
BUN: 30 mg/dL — AB (ref 6–23)
CALCIUM: 9.2 mg/dL (ref 8.4–10.5)
CHLORIDE: 110 meq/L (ref 96–112)
CO2: 18 mEq/L — ABNORMAL LOW (ref 19–32)
Creat: 2.13 mg/dL — ABNORMAL HIGH (ref 0.50–1.10)
GLUCOSE: 90 mg/dL (ref 70–99)
POTASSIUM: 4.4 meq/L (ref 3.5–5.3)
Sodium: 144 mEq/L (ref 135–145)
Total Bilirubin: 0.4 mg/dL (ref 0.3–1.2)
Total Protein: 7.2 g/dL (ref 6.0–8.3)

## 2013-10-31 LAB — CBC
HEMATOCRIT: 43 % (ref 36.0–46.0)
HEMOGLOBIN: 14 g/dL (ref 12.0–15.0)
MCH: 27.8 pg (ref 26.0–34.0)
MCHC: 32.6 g/dL (ref 30.0–36.0)
MCV: 85.3 fL (ref 78.0–100.0)
Platelets: 272 10*3/uL (ref 150–400)
RBC: 5.04 MIL/uL (ref 3.87–5.11)
RDW: 14.9 % (ref 11.5–15.5)
WBC: 5.2 10*3/uL (ref 4.0–10.5)

## 2013-10-31 LAB — TSH: TSH: 1.468 u[IU]/mL (ref 0.350–4.500)

## 2013-11-21 ENCOUNTER — Other Ambulatory Visit: Payer: Self-pay | Admitting: *Deleted

## 2013-11-21 ENCOUNTER — Telehealth: Payer: Self-pay | Admitting: *Deleted

## 2013-11-21 MED ORDER — ATORVASTATIN CALCIUM 40 MG PO TABS: 40.0000 mg | ORAL_TABLET | Freq: Every day | ORAL | Status: DC

## 2013-11-21 NOTE — Telephone Encounter (Signed)
Left message to return call to discuss lab results

## 2014-04-21 ENCOUNTER — Other Ambulatory Visit (HOSPITAL_COMMUNITY): Payer: Self-pay | Admitting: Internal Medicine

## 2014-04-21 DIAGNOSIS — Z139 Encounter for screening, unspecified: Secondary | ICD-10-CM

## 2014-04-23 ENCOUNTER — Ambulatory Visit (HOSPITAL_COMMUNITY)
Admission: RE | Admit: 2014-04-23 | Discharge: 2014-04-23 | Disposition: A | Discharge status: Home / Self Care | Payer: BC Managed Care – PPO | Source: Ambulatory Visit | Attending: Internal Medicine | Admitting: Internal Medicine

## 2014-04-23 DIAGNOSIS — Z139 Encounter for screening, unspecified: Secondary | ICD-10-CM

## 2014-04-23 DIAGNOSIS — Z1231 Encounter for screening mammogram for malignant neoplasm of breast: Secondary | ICD-10-CM | POA: Insufficient documentation

## 2014-08-11 ENCOUNTER — Encounter: Payer: Self-pay | Admitting: Cardiovascular Disease

## 2014-09-25 ENCOUNTER — Other Ambulatory Visit (HOSPITAL_COMMUNITY): Payer: Self-pay | Admitting: Family Medicine

## 2014-09-25 DIAGNOSIS — R1013 Epigastric pain: Secondary | ICD-10-CM

## 2014-09-25 DIAGNOSIS — R109 Unspecified abdominal pain: Secondary | ICD-10-CM

## 2014-09-26 ENCOUNTER — Ambulatory Visit (HOSPITAL_COMMUNITY)
Admission: RE | Admit: 2014-09-26 | Discharge: 2014-09-26 | Disposition: A | Discharge status: Home / Self Care | Payer: BC Managed Care – PPO | Source: Ambulatory Visit | Attending: Family Medicine | Admitting: Family Medicine

## 2014-09-26 DIAGNOSIS — R109 Unspecified abdominal pain: Secondary | ICD-10-CM

## 2014-09-26 DIAGNOSIS — Q613 Polycystic kidney, unspecified: Secondary | ICD-10-CM | POA: Diagnosis not present

## 2014-09-26 DIAGNOSIS — K7689 Other specified diseases of liver: Secondary | ICD-10-CM | POA: Insufficient documentation

## 2014-09-26 DIAGNOSIS — N281 Cyst of kidney, acquired: Secondary | ICD-10-CM | POA: Insufficient documentation

## 2014-09-26 DIAGNOSIS — K8020 Calculus of gallbladder without cholecystitis without obstruction: Secondary | ICD-10-CM | POA: Insufficient documentation

## 2014-09-26 DIAGNOSIS — R1013 Epigastric pain: Secondary | ICD-10-CM

## 2014-10-06 ENCOUNTER — Encounter: Payer: Self-pay | Admitting: Internal Medicine

## 2014-11-03 ENCOUNTER — Other Ambulatory Visit: Payer: Self-pay

## 2014-11-03 ENCOUNTER — Encounter: Payer: Self-pay | Admitting: Gastroenterology

## 2014-11-03 ENCOUNTER — Ambulatory Visit (INDEPENDENT_AMBULATORY_CARE_PROVIDER_SITE_OTHER): Payer: BC Managed Care – PPO | Admitting: Gastroenterology

## 2014-11-03 VITALS — BP 152/90 | HR 64 | Temp 97.9°F | Ht 63.0 in | Wt 259.8 lb

## 2014-11-03 DIAGNOSIS — R1013 Epigastric pain: Secondary | ICD-10-CM

## 2014-11-03 MED ORDER — PANTOPRAZOLE SODIUM 40 MG PO TBEC: 40.0000 mg | DELAYED_RELEASE_TABLET | Freq: Every day | ORAL | Status: AC

## 2014-11-03 NOTE — Progress Notes (Signed)
    Primary Care Physician:  Dr. Koberlein Primary Gastroenterologist:  Dr. Rourk   Chief Complaint  Patient presents with  . Abdominal Pain  . Gas    HPI:   Tammy Farrell is a 53 y.o. female presenting today at the request of Dr. Koberlein secondary to abdominal pain.   She notes several month history of upper abdominal pain, across abdomen, makes her stop in her tracks, gets hot and starts sweating, can feel knots across upper abdomen. Episodes 3-4 times per day, intermittent throughout the day. Associated nausea but no vomiting. Eating exacerbates but episodes can occur without eating. May last 30-40 minutes. No GERD symptoms. Prilosec without much improvement in the past. Only on Carafate now.  No lower GI symptoms. No dysphagia.    EGD 2009 by Dr. Rourk normal esophagus, small hiatal hernia, normal D1 and D2. Colonoscopy with poor prep, long redundant colon and normal rectum.      Past Medical History  Diagnosis Date  . Hypertension   . Hyperlipidemia   . Chronic kidney disease     Polycystic Kidney Disease  . Asthma   . Seizures     x1 many years ago when pt was being pre-oped for another surgery  . Anxiety     Past Surgical History  Procedure Laterality Date  . Abdominal hysterectomy    . Wrist fracture surgery  left  . Orif ankle fracture  08/18/2011    Procedure: OPEN REDUCTION INTERNAL FIXATION (ORIF) ANKLE FRACTURE;  Surgeon: Wayne Keeling;  Location: [facility];  Service: Orthopedics;  Laterality: Left;  . Hardware removal  10/18/2011    Procedure: HARDWARE REMOVAL;  Surgeon: Wayne Keeling;  Location: [facility];  Service: Orthopedics;  Laterality: Left;  Removal Screw Left Ankle  . Esophagogastroduodenoscopy  2009    Dr. Rourk:  normal esophagus, small hiatal hernia, normal D1 and D2.   . Colonoscopy  2009    Dr. Rourk: Colonoscopy with poor prep, long redundant colon and normal rectum.     Current Outpatient Prescriptions  Medication Sig Dispense Refill  .  albuterol (PROVENTIL HFA;VENTOLIN HFA) 108 (90 BASE) MCG/ACT inhaler Inhale 2 puffs into the lungs every 6 (six) hours as needed for wheezing or shortness of breath.    . amLODipine (NORVASC) 5 MG tablet Take 1 tablet (5 mg total) by mouth daily. 90 tablet 3  . aspirin EC 81 MG tablet Take 81 mg by mouth daily.      . nebivolol (BYSTOLIC) 10 MG tablet Take 1 tablet (10 mg total) by mouth daily. 30 tablet 11  . sucralfate (CARAFATE) 1 G tablet     . pantoprazole (PROTONIX) 40 MG tablet Take 1 tablet (40 mg total) by mouth daily. 30 minutes before breakfast. 90 tablet 3   No current facility-administered medications for this visit.    Allergies as of 11/03/2014  . (No Known Allergies)    Family History  Problem Relation Age of Onset  . Anesthesia problems Neg Hx   . Hypotension Neg Hx   . Malignant hyperthermia Neg Hx   . Pseudochol deficiency Neg Hx   . Heart failure Mother   . Stroke Other   . Colon cancer Neg Hx     History   Social History  . Marital Status: Married    Spouse Name: N/A    Number of Children: N/A  . Years of Education: N/A   Occupational History  . cafeteria     [employer]     Social History Main Topics  . Smoking status: Never Smoker   . Smokeless tobacco: Not on file  . Alcohol Use: No  . Drug Use: No  . Sexual Activity: Not on file   Other Topics Concern  . Not on file   Social History Narrative    Review of Systems: Negative unless mentioned in HPI  Physical Exam: BP 152/90 mmHg  Pulse 64  Temp(Src) 97.9 F (36.6 C) (Oral)  Ht 5' 3" (1.6 m)  Wt 259 lb 12.8 oz (117.845 kg)  BMI 46.03 kg/m2 General:   Alert and oriented. Pleasant and cooperative. Well-nourished and well-developed.  Head:  Normocephalic and atraumatic. Eyes:  Without icterus, sclera clear and conjunctiva pink.  Ears:  Normal auditory acuity. Nose:  No deformity, discharge,  or lesions. Mouth:  No deformity or lesions, oral mucosa pink.  Lungs:  Clear to  auscultation bilaterally. No wheezes, rales, or rhonchi. No distress.  Heart:  S1, S2 present without murmurs appreciated.  Abdomen:  +BS, soft, mild TTP upper abdomen and non-distended. No HSM noted. No guarding or rebound. No masses appreciated.  Rectal:  Deferred  Msk:  Symmetrical without gross deformities. Normal posture. Extremities:  Without edema. Neurologic:  Alert and  oriented x4;  grossly normal neurologically. Skin:  Intact without significant lesions or rashes. Psych:  Alert and cooperative. Normal mood and affect.  US abdomen Dec 2015 IMPRESSION: 1. No acute abdominal findings identified. 2. Cholelithiasis without evidence of cholecystitis. Common bileduct diameter is at the upper limits of normal. 3. Multiple hepatic and renal cysts.  Probable gallbladder polyp   

## 2014-11-03 NOTE — Patient Instructions (Signed)
You have been scheduled for an upper endoscopy with Dr. Jena Gaussourk in the near future.   You will need a colonoscopy at a later date.   Start taking Protonix once each morning, 30 minutes before breakfast. I sent this to your pharmacy.

## 2014-11-03 NOTE — Assessment & Plan Note (Signed)
54 year old female with several month history of upper abdominal pain and associated nausea, with differentials to include gastritis, PUD, possible biliary dyskinesia. US abdomen on file with cholelithiasis but no evidence of cholecystitis. HIDA in remote past (2009) with EF of 32%. At that time, biliary etiology was not felt to be a contributor. As last upper GI evaluation was in 2009 (normal), recommend updating via EGD and subsequent referral to General Surgery if negative EGD. In interim, start on Protonix once daily. As of note, known gallbladder polyp. If any concern for biliary etiology, would recommend cholecystectomy especially in light of known polyp.   Proceed with upper endoscopy in the near future with Dr. Jena Gaussourk. The risks, benefits, and alternatives have been discussed in detail with patient. They have stated understanding and desire to proceed.  Protonix once daily Likely referral to Dr. Lovell SheehanJenkins after EGD completed Routine screening colonoscopy due now: patient has declined until current issue is resolved. No concerning lower GI symptoms.

## 2014-11-04 ENCOUNTER — Other Ambulatory Visit: Payer: Self-pay

## 2014-11-04 ENCOUNTER — Encounter (HOSPITAL_COMMUNITY)
Admission: RE | Disposition: A | Discharge status: Home / Self Care | Payer: Self-pay | Source: Ambulatory Visit | Attending: Internal Medicine

## 2014-11-04 ENCOUNTER — Ambulatory Visit (HOSPITAL_COMMUNITY)
Admission: RE | Admit: 2014-11-04 | Discharge: 2014-11-04 | Disposition: A | Discharge status: Home / Self Care | Payer: BC Managed Care – PPO | Source: Ambulatory Visit | Attending: Internal Medicine | Admitting: Internal Medicine

## 2014-11-04 ENCOUNTER — Encounter (HOSPITAL_COMMUNITY): Payer: Self-pay

## 2014-11-04 ENCOUNTER — Ambulatory Visit (HOSPITAL_COMMUNITY): Payer: BC Managed Care – PPO

## 2014-11-04 DIAGNOSIS — Z7982 Long term (current) use of aspirin: Secondary | ICD-10-CM | POA: Insufficient documentation

## 2014-11-04 DIAGNOSIS — F419 Anxiety disorder, unspecified: Secondary | ICD-10-CM | POA: Diagnosis not present

## 2014-11-04 DIAGNOSIS — E785 Hyperlipidemia, unspecified: Secondary | ICD-10-CM | POA: Insufficient documentation

## 2014-11-04 DIAGNOSIS — J45909 Unspecified asthma, uncomplicated: Secondary | ICD-10-CM | POA: Diagnosis not present

## 2014-11-04 DIAGNOSIS — I129 Hypertensive chronic kidney disease with stage 1 through stage 4 chronic kidney disease, or unspecified chronic kidney disease: Secondary | ICD-10-CM | POA: Diagnosis not present

## 2014-11-04 DIAGNOSIS — K449 Diaphragmatic hernia without obstruction or gangrene: Secondary | ICD-10-CM | POA: Diagnosis not present

## 2014-11-04 DIAGNOSIS — R1011 Right upper quadrant pain: Secondary | ICD-10-CM

## 2014-11-04 DIAGNOSIS — K2100 Gastro-esophageal reflux disease with esophagitis, without bleeding: Secondary | ICD-10-CM | POA: Insufficient documentation

## 2014-11-04 DIAGNOSIS — R1013 Epigastric pain: Secondary | ICD-10-CM | POA: Diagnosis present

## 2014-11-04 DIAGNOSIS — R109 Unspecified abdominal pain: Secondary | ICD-10-CM

## 2014-11-04 DIAGNOSIS — N189 Chronic kidney disease, unspecified: Secondary | ICD-10-CM | POA: Diagnosis not present

## 2014-11-04 HISTORY — PX: ESOPHAGOGASTRODUODENOSCOPY: SHX5428

## 2014-11-04 SURGERY — EGD (ESOPHAGOGASTRODUODENOSCOPY)
Anesthesia: Moderate Sedation

## 2014-11-04 MED ORDER — MEPERIDINE HCL 100 MG/ML IJ SOLN
INTRAMUSCULAR | Status: AC
  Filled 2014-11-04: qty 2

## 2014-11-04 MED ORDER — MEPERIDINE HCL 100 MG/ML IJ SOLN
INTRAMUSCULAR | Status: DC | PRN
  Administered 2014-11-04 (×3): 25 mg

## 2014-11-04 MED ORDER — MIDAZOLAM HCL 5 MG/5ML IJ SOLN
INTRAMUSCULAR | Status: AC
  Filled 2014-11-04: qty 10

## 2014-11-04 MED ORDER — ONDANSETRON HCL 4 MG/2ML IJ SOLN
INTRAMUSCULAR | Status: DC | PRN
  Administered 2014-11-04: 4 mg via INTRAVENOUS

## 2014-11-04 MED ORDER — LIDOCAINE VISCOUS 2 % MT SOLN
OROMUCOSAL | Status: AC
  Filled 2014-11-04: qty 15

## 2014-11-04 MED ORDER — SODIUM CHLORIDE 0.9 % IV SOLN
INTRAVENOUS | Status: DC
  Administered 2014-11-04: 09:00:00 via INTRAVENOUS

## 2014-11-04 MED ORDER — STERILE WATER FOR IRRIGATION IR SOLN
Status: DC | PRN
  Administered 2014-11-04: 10:00:00

## 2014-11-04 MED ORDER — MIDAZOLAM HCL 5 MG/5ML IJ SOLN
INTRAMUSCULAR | Status: DC | PRN
  Administered 2014-11-04: 1 mg via INTRAVENOUS
  Administered 2014-11-04 (×2): 2 mg via INTRAVENOUS

## 2014-11-04 MED ORDER — LIDOCAINE VISCOUS 2 % MT SOLN
OROMUCOSAL | Status: DC | PRN
  Administered 2014-11-04: 3 mL via OROMUCOSAL

## 2014-11-04 MED ORDER — ONDANSETRON HCL 4 MG/2ML IJ SOLN
INTRAMUSCULAR | Status: AC
  Filled 2014-11-04: qty 2

## 2014-11-04 NOTE — Progress Notes (Addendum)
Patient developed epigastric and right upper quadrant abdominal pain immediately following the EGD. This was a straight forward diagnostic only procedure. Patient tearful  - rates pain 10 out of 10. Tender epigastric and right upper quadrant to my exam with guarding. States this is identical to the attack she has been having recently. Acute pain appears pretty impressive at this time. I doubt a complication from the EGD. However, we'll keep her here NPO and proceed with a CT scan of the abdomen and pelvis to make sure nothing else is going on. Findings of EGD and workup today fully discussed with patient's husband at the bedside.  1230 pt completed drinking 2nd bottle of contrast.

## 2014-11-04 NOTE — Progress Notes (Signed)
cc'ed to pcp °

## 2014-11-04 NOTE — Interval H&P Note (Signed)
History and Physical Interval Note:  11/04/2014 10:01 AM  Tammy Farrell  has presented today for surgery, with the diagnosis of dyspepsia  The various methods of treatment have been discussed with the patient and family. After consideration of risks, benefits and other options for treatment, the patient has consented to  Procedure(s) with comments: ESOPHAGOGASTRODUODENOSCOPY (EGD) (N/A) - 3:15pm as a surgical intervention .  The patient's history has been reviewed, patient examined, no change in status, stable for surgery.  I have reviewed the patient's chart and labs.  Questions were answered to the patient's satisfaction.     Tammy Farrell   No change. Hasn't started Protonix yet. EGD per plan.The risks, benefits, limitations, alternatives and imponderables have been reviewed with the patient. Potential for esophageal dilation, biopsy, etc. have also been reviewed.  Questions have been answered. All parties agreeable.

## 2014-11-04 NOTE — Progress Notes (Signed)
Patient now pain-free. Reviewed non-IV contrast CT. Bilateral renal cyst. Cholelithiasis. Nothing acute. No free air, etc. I repeated my exam and her abdomen is soft and nontender. She is tolerating clear liquids.. I suspect this episode of pain is representative most likely of recurrent biliary colic coincidental with EGD. We'll allow her to go home and advance slowly to a low-fat diet. Will proceed with arranging an appointment for her to see Dr. Lovell SheehanJenkins in reference to an elective cholecystectomy

## 2014-11-04 NOTE — Op Note (Signed)
[facility] [address] [place], [id number]   ENDOSCOPY PROCEDURE REPORT  PATIENT: Tammy Farrell, Earsie M  MR#: [id number] BIRTHDATE: 02-25-1961 , 53  yrs. old GENDER: female ENDOSCOPIST: R.  Roetta SessionsMichael Ahmiya Abee, MD FACP FACG REFERRED BY:  Theodis ShoveJunell Koberlein, M.D. PROCEDURE DATE:  11/04/2014 PROCEDURE:  EGD, diagnostic INDICATIONS:  dyspepsia. MEDICATIONS: Versed 5 mg IV and Demerol 75 mg IV in divided doses. Zofran 4 mg IV.  Xylocaine gel orally ASA CLASS:      Class II  CONSENT: The risks, benefits, limitations, alternatives and imponderables have been discussed.  The potential for biopsy, esophogeal dilation, etc. have also been reviewed.  Questions have been answered.  All parties agreeable.  Please see the history and physical in the medical record for more information.  DESCRIPTION OF PROCEDURE: After the risks benefits and alternatives of the procedure were thoroughly explained, informed consent was obtained.  The [id number] ([id number]) endoscope was introduced through the mouth and advanced to the second portion of the duodenum , limited by Without limitations. The instrument was slowly withdrawn as the mucosa was fully examined.    Single 4 mm distal esophageal erosion; otherwise, esophagus appeared normal.  EG junction easily traversed.  Stomach empty.  3 cm hiatal hernia.  Normal gastric mucosa.  Patent pylorus.  Normal first and second portion of the duodenum.  Retroflexed views revealed no abnormalities and Retroflexed views revealed as previously described.     The scope was then withdrawn from the patient and the procedure completed.  COMPLICATIONS:  ENDOSCOPIC IMPRESSION: Erosive reflux esophagitis; hiatal hernia  RECOMMENDATIONS: Begin Protonix 40 mg orally daily as prescribed yesterday. It remains a reasonably good possibility that patient has symptomatic cholelithiasis to account for her right upper quadrant abdominal pain. She does have reflux. She  also likely has a gallbladder polyp. I suspect she will be meeting the surgeon at some point in the future, however, would like to see how she does with acid suppression therapy in the coming weeks before deciding about surgical referral.  REPEAT EXAM:  eSigned:  R. Roetta SessionsMichael Allora Bains, MD Jerrel IvoryFACP [facility] 11/04/2014 10:35 AM    CC:  CPT CODES: ICD CODES:  The ICD and CPT codes recommended by this software are interpretations from the data that the clinical staff has captured with the software.  The verification of the translation of this report to the ICD and CPT codes and modifiers is the sole responsibility of the health care institution and practicing physician where this report was generated.  [employer] will not be held responsible for the validity of the ICD and CPT codes included on this report.  AMA assumes no liability for data contained or not contained herein. CPT is a Publishing rights managerregistered trademark of the [employer].  PATIENT NAME:  Tammy Farrell, [name] MR#: [id number]

## 2014-11-04 NOTE — Discharge Instructions (Addendum)
EGD Discharge instructions Please read the instructions outlined below and refer to this sheet in the next few weeks. These discharge instructions provide you with general information on caring for yourself after you leave the hospital. Your doctor may also give you specific instructions. While your treatment has been planned according to the most current medical practices available, unavoidable complications occasionally occur. If you have any problems or questions after discharge, please call your doctor. ACTIVITY  You may resume your regular activity but move at a slower pace for the next 24 hours.   Take frequent rest periods for the next 24 hours.   Walking will help expel (get rid of) the air and reduce the bloated feeling in your abdomen.   No driving for 24 hours (because of the anesthesia (medicine) used during the test).   You may shower.   Do not sign any important legal documents or operate any machinery for 24 hours (because of the anesthesia used during the test).  NUTRITION  Drink plenty of fluids.   You may resume your normal diet.   Begin with a light meal and progress to your normal diet.   Avoid alcoholic beverages for 24 hours or as instructed by your caregiver.  MEDICATIONS  You may resume your normal medications unless your caregiver tells you otherwise.  WHAT YOU CAN EXPECT TODAY  You may experience abdominal discomfort such as a feeling of fullness or gas pains.  FOLLOW-UP  Your doctor will discuss the results of your test with you.  SEEK IMMEDIATE MEDICAL ATTENTION IF ANY OF THE FOLLOWING OCCUR:  Excessive nausea (feeling sick to your stomach) and/or vomiting.   Severe abdominal pain and distention (swelling).   Trouble swallowing.   Temperature over 101 F (37.8 C).   Rectal bleeding or vomiting of blood.   GERD and hiatal hernia information provided  Begin Protonix 40 mg daily  If upper abdominal pain is not improved in 3 weeks, patient  is to let us know. If that becomes the case, will refer to a general surgeon for gallbladder removal.  Appointment with Dr.Jenkis to set up for cholecystectomy  Low-Fat Diet for Pancreatitis or Gallbladder Conditions A low-fat diet can be helpful if you have pancreatitis or a gallbladder condition. With these conditions, your pancreas and gallbladder have trouble digesting fats. A healthy eating plan with less fat will help rest your pancreas and gallbladder and reduce your symptoms. WHAT DO I NEED TO KNOW ABOUT THIS DIET?  Eat a low-fat diet.  Reduce your fat intake to less than 20-30% of your total daily calories. This is less than 50-60 g of fat per day.  Remember that you need some fat in your diet. Ask your dietician what your daily goal should be.  Choose nonfat and low-fat healthy foods. Look for the words "nonfat," "low fat," or "fat free."  As a guide, look on the label and choose foods with less than 3 g of fat per serving. Eat only one serving.  Avoid alcohol.  Do not smoke. If you need help quitting, talk with your health care provider.  Eat small frequent meals instead of three large heavy meals. WHAT FOODS CAN I EAT? Grains Include healthy grains and starches such as potatoes, wheat bread, fiber-rich cereal, and brown rice. Choose whole grain options whenever possible. In adults, whole grains should account for 45-65% of your daily calories.  Fruits and Vegetables Eat plenty of fruits and vegetables. Fresh fruits and vegetables add fiber to your  diet. Meats and Other Protein Sources Eat lean meat such as chicken and pork. Trim any fat off of meat before cooking it. Eggs, fish, and beans are other sources of protein. In adults, these foods should account for 10-35% of your daily calories. Dairy Choose low-fat milk and dairy options. Dairy includes fat and protein, as well as calcium.  Fats and Oils Limit high-fat foods such as fried foods, sweets, baked goods, sugary  drinks.  Other Creamy sauces and condiments, such as mayonnaise, can add extra fat. Think about whether or not you need to use them, or use smaller amounts or low fat options. WHAT FOODS ARE NOT RECOMMENDED?  High fat foods, such as:  [employer].  Ice cream.  [place] toast.  Sweet rolls.  Pizza.  Cheese bread.  Foods covered with batter, butter, creamy sauces, or cheese.  Fried foods.  Sugary drinks and desserts.  Foods that cause gas or bloating Document Released: 10/01/2013 Document Reviewed: 10/01/2013 [facility] Patient Information 2015 [place], [place]. This information is not intended to replace advice given to you by your health care provider. Make sure you discuss any questions you have with your health care provider.   Hiatal Hernia A hiatal hernia occurs when part of your stomach slides above the muscle that separates your abdomen from your chest (diaphragm). You can be born with a hiatal hernia (congenital), or it may develop over time. In almost all cases of hiatal hernia, only the top part of the stomach pushes through.  Many people have a hiatal hernia with no symptoms. The larger the hernia, the more likely that you will have symptoms. In some cases, a hiatal hernia allows stomach acid to flow back into the tube that carries food from your mouth to your stomach (esophagus). This may cause heartburn symptoms. Severe heartburn symptoms may mean you have developed a condition called gastroesophageal reflux disease (GERD).  CAUSES  Hiatal hernias are caused by a weakness in the opening (hiatus) where your esophagus passes through your diaphragm to attach to the upper part of your stomach. You may be born with a weakness in your hiatus, or a weakness can develop. RISK FACTORS Older age is a major risk factor for a hiatal hernia. Anything that increases pressure on your diaphragm can also increase your risk of a hiatal hernia. This includes:  Pregnancy.  Excess  weight.  Frequent constipation. SIGNS AND SYMPTOMS  People with a hiatal hernia often have no symptoms. If symptoms develop, they are almost always caused by GERD. They may include:  Heartburn.  Belching.  Indigestion.  Trouble swallowing.  Coughing or wheezing.  Sore throat.  Hoarseness.  Chest pain. DIAGNOSIS  A hiatal hernia is sometimes found during an exam for another problem. Your health care provider may suspect a hiatal hernia if you have symptoms of GERD. Tests may be done to diagnose GERD. These may include:  X-rays of your stomach or chest.  An upper gastrointestinal (GI) series. This is an X-ray exam of your GI tract involving the use of a chalky liquid that you swallow. The liquid shows up clearly on the X-ray.  Endoscopy. This is a procedure to look into your stomach using a thin, flexible tube that has a tiny camera and light on the end of it. TREATMENT  If you have no symptoms, you may not need treatment. If you have symptoms, treatment may include:  Dietary and lifestyle changes to help reduce GERD symptoms.  Medicines. These may include:  Over-the-counter  antacids.  Medicines that make your stomach empty more quickly.  Medicines that block the production of stomach acid (H2 blockers).  Stronger medicines to reduce stomach acid (proton pump inhibitors).  You may need surgery to repair the hernia if other treatments are not helping. HOME CARE INSTRUCTIONS   Take all medicines as directed by your health care provider.  Quit smoking, if you smoke.  Try to achieve and maintain a healthy body weight.  Eat frequent small meals instead of three large meals a day. This keeps your stomach from getting too full.  Eat slowly.  Do not lie down right after eating.  Do noteat 1-2 hours before bed.   Do not drink beverages with caffeine. These include cola, coffee, cocoa, and tea.  Do not drink alcohol.  Avoid foods that can make symptoms of GERD  worse. These may include:  Fatty foods.  Citrus fruits.  Other foods and drinks that contain acid.  Avoid putting pressure on your belly. Anything that puts pressure on your belly increases the amount of acid that may be pushed up into your esophagus.   Avoid bending over, especially after eating.  Raise the head of your bed by putting blocks under the legs. This keeps your head and esophagus higher than your stomach.  Do not wear tight clothing around your chest or stomach.  Try not to strain when having a bowel movement, when urinating, or when lifting heavy objects. SEEK MEDICAL CARE IF:  Your symptoms are not controlled with medicines or lifestyle changes.  You are having trouble swallowing.  You have coughing or wheezing that will not go away. SEEK IMMEDIATE MEDICAL CARE IF:  Your pain is getting worse.  Your pain spreads to your arms, neck, jaw, teeth, or back.  You have shortness of breath.  You sweat for no reason.  You feel sick to your stomach (nauseous) or vomit.  You vomit blood.  You have bright red blood in your stools.  You have black, tarry stools.  Document Released: 12/17/2003 Document Revised: 02/10/2014 Document Reviewed: 09/13/2013 [facility] Patient Information 2015 [place], [place]. This information is not intended to replace advice given to you by your health care provider. Make sure you discuss any questions you have with your health care provider.   Gastroesophageal Reflux Disease, Adult Gastroesophageal reflux disease (GERD) happens when acid from your stomach flows up into the esophagus. When acid comes in contact with the esophagus, the acid causes soreness (inflammation) in the esophagus. Over time, GERD may create small holes (ulcers) in the lining of the esophagus. CAUSES   Increased body weight. This puts pressure on the stomach, making acid rise from the stomach into the esophagus.  Smoking. This increases acid production in the  stomach.  Drinking alcohol. This causes decreased pressure in the lower esophageal sphincter (valve or ring of muscle between the esophagus and stomach), allowing acid from the stomach into the esophagus.  Late evening meals and a full stomach. This increases pressure and acid production in the stomach.  A malformed lower esophageal sphincter. Sometimes, no cause is found. SYMPTOMS   Burning pain in the lower part of the mid-chest behind the breastbone and in the mid-stomach area. This may occur twice a week or more often.  Trouble swallowing.  Sore throat.  Dry cough.  Asthma-like symptoms including chest tightness, shortness of breath, or wheezing. DIAGNOSIS  Your caregiver may be able to diagnose GERD based on your symptoms. In some cases, X-rays and other tests  may be done to check for complications or to check the condition of your stomach and esophagus. TREATMENT  Your caregiver may recommend over-the-counter or prescription medicines to help decrease acid production. Ask your caregiver before starting or adding any new medicines.  HOME CARE INSTRUCTIONS   Change the factors that you can control. Ask your caregiver for guidance concerning weight loss, quitting smoking, and alcohol consumption.  Avoid foods and drinks that make your symptoms worse, such as:  Caffeine or alcoholic drinks.  Chocolate.  Peppermint or mint flavorings.  Garlic and onions.  Spicy foods.  Citrus fruits, such as oranges, lemons, or limes.  Tomato-based foods such as sauce, chili, salsa, and pizza.  Fried and fatty foods.  Avoid lying down for the 3 hours prior to your bedtime or prior to taking a nap.  Eat small, frequent meals instead of large meals.  Wear loose-fitting clothing. Do not wear anything tight around your waist that causes pressure on your stomach.  Raise the head of your bed 6 to 8 inches with wood blocks to help you sleep. Extra pillows will not help.  Only take  over-the-counter or prescription medicines for pain, discomfort, or fever as directed by your caregiver.  Do not take aspirin, ibuprofen, or other nonsteroidal anti-inflammatory drugs (NSAIDs). SEEK IMMEDIATE MEDICAL CARE IF:   You have pain in your arms, neck, jaw, teeth, or back.  Your pain increases or changes in intensity or duration.  You develop nausea, vomiting, or sweating (diaphoresis).  You develop shortness of breath, or you faint.  Your vomit is green, yellow, black, or looks like coffee grounds or blood.  Your stool is red, bloody, or black. These symptoms could be signs of other problems, such as heart disease, gastric bleeding, or esophageal bleeding. MAKE SURE YOU:   Understand these instructions.  Will watch your condition.  Will get help right away if you are not doing well or get worse. Document Released: 07/06/2005 Document Revised: 12/19/2011 Document Reviewed: 04/15/2011 [facility] Patient Information 2015 [place], [place]. This information is not intended to replace advice given to you by your health care provider. Make sure you discuss any questions you have with your health care provider.

## 2014-11-04 NOTE — H&P (View-Only) (Signed)
Primary Care Physician:  Dr. Hassan RowanKoberlein Primary Gastroenterologist:  Dr. Jena Gaussourk   Chief Complaint  Patient presents with  . Abdominal Pain  . Gas    HPI:   Tammy Farrell is a 54 y.o. female presenting today at the request of Dr. Hassan RowanKoberlein secondary to abdominal pain.   She notes several month history of upper abdominal pain, across abdomen, makes her stop in her tracks, gets hot and starts sweating, can feel knots across upper abdomen. Episodes 3-4 times per day, intermittent throughout the day. Associated nausea but no vomiting. Eating exacerbates but episodes can occur without eating. May last 30-40 minutes. No GERD symptoms. Prilosec without much improvement in the past. Only on Carafate now.  No lower GI symptoms. No dysphagia.    EGD 2009 by Dr. Jena Gaussourk normal esophagus, small hiatal hernia, normal D1 and D2. Colonoscopy with poor prep, long redundant colon and normal rectum.      Past Medical History  Diagnosis Date  . Hypertension   . Hyperlipidemia   . Chronic kidney disease     Polycystic Kidney Disease  . Asthma   . Seizures     x1 many years ago when pt was being pre-oped for another surgery  . Anxiety     Past Surgical History  Procedure Laterality Date  . Abdominal hysterectomy    . Wrist fracture surgery  left  . Orif ankle fracture  08/18/2011    Procedure: OPEN REDUCTION INTERNAL FIXATION (ORIF) ANKLE FRACTURE;  Surgeon: Darreld McleanWayne Keeling;  Location: [facility];  Service: Orthopedics;  Laterality: Left;  . Hardware removal  10/18/2011    Procedure: HARDWARE REMOVAL;  Surgeon: Darreld McleanWayne Keeling;  Location: [facility];  Service: Orthopedics;  Laterality: Left;  Removal Screw Left Ankle  . Esophagogastroduodenoscopy  2009    Dr. Jena Gaussourk:  normal esophagus, small hiatal hernia, normal D1 and D2.   . Colonoscopy  2009    Dr. Jena Gaussourk: Colonoscopy with poor prep, long redundant colon and normal rectum.     Current Outpatient Prescriptions  Medication Sig Dispense Refill  .  albuterol (PROVENTIL HFA;VENTOLIN HFA) 108 (90 BASE) MCG/ACT inhaler Inhale 2 puffs into the lungs every 6 (six) hours as needed for wheezing or shortness of breath.    Marland Kitchen. amLODipine (NORVASC) 5 MG tablet Take 1 tablet (5 mg total) by mouth daily. 90 tablet 3  . aspirin EC 81 MG tablet Take 81 mg by mouth daily.      . nebivolol (BYSTOLIC) 10 MG tablet Take 1 tablet (10 mg total) by mouth daily. 30 tablet 11  . sucralfate (CARAFATE) 1 G tablet     . pantoprazole (PROTONIX) 40 MG tablet Take 1 tablet (40 mg total) by mouth daily. 30 minutes before breakfast. 90 tablet 3   No current facility-administered medications for this visit.    Allergies as of 11/03/2014  . (No Known Allergies)    Family History  Problem Relation Age of Onset  . Anesthesia problems Neg Hx   . Hypotension Neg Hx   . Malignant hyperthermia Neg Hx   . Pseudochol deficiency Neg Hx   . Heart failure Mother   . Stroke Other   . Colon cancer Neg Hx     History   Social History  . Marital Status: Married    Spouse Name: N/A    Number of Children: N/A  . Years of Education: N/A   Occupational History  . cafeteria     [employer]  Social History Main Topics  . Smoking status: Never Smoker   . Smokeless tobacco: Not on file  . Alcohol Use: No  . Drug Use: No  . Sexual Activity: Not on file   Other Topics Concern  . Not on file   Social History Narrative    Review of Systems: Negative unless mentioned in HPI  Physical Exam: BP 152/90 mmHg  Pulse 64  Temp(Src) 97.9 F (36.6 C) (Oral)  Ht  (1.6 m)  Wt 259 lb 12.8 oz (117.845 kg)  BMI 46.03 kg/m2 General:   Alert and oriented. Pleasant and cooperative. Well-nourished and well-developed.  Head:  Normocephalic and atraumatic. Eyes:  Without icterus, sclera clear and conjunctiva pink.  Ears:  Normal auditory acuity. Nose:  No deformity, discharge,  or lesions. Mouth:  No deformity or lesions, oral mucosa pink.  Lungs:  Clear to  auscultation bilaterally. No wheezes, rales, or rhonchi. No distress.  Heart:  S1, S2 present without murmurs appreciated.  Abdomen:  +BS, soft, mild TTP upper abdomen and non-distended. No HSM noted. No guarding or rebound. No masses appreciated.  Rectal:  Deferred  Msk:  Symmetrical without gross deformities. Normal posture. Extremities:  Without edema. Neurologic:  Alert and  oriented x4;  grossly normal neurologically. Skin:  Intact without significant lesions or rashes. Psych:  Alert and cooperative. Normal mood and affect.  US abdomen Dec 2015 IMPRESSION: 1. No acute abdominal findings identified. 2. Cholelithiasis without evidence of cholecystitis. Common bileduct diameter is at the upper limits of normal. 3. Multiple hepatic and renal cysts.  Probable gallbladder polyp

## 2014-11-04 NOTE — Progress Notes (Addendum)
Pt crying stating pain 10/10 in epigastric area; warm cloth given per patient request; Dr Jena Gaussourk made aware of pain and increased blood pressure;  Dr Jena Gaussourk in to speak with patient & husband.  CT abdomen/pelvis ordered STAT with patient remaining NPO until after CT exam.  Per Radiology, pt creatinine level elevated; Oral contrast only.  11:00 pt started drinking contrast.  1230 pt completed drinking contrast.  Rates pain at 0/10.  1300 Pt transferred to radiology for CT scan

## 2014-11-05 ENCOUNTER — Telehealth: Payer: Self-pay

## 2014-11-05 ENCOUNTER — Encounter (HOSPITAL_COMMUNITY): Payer: Self-pay | Admitting: Internal Medicine

## 2014-11-05 NOTE — Telephone Encounter (Signed)
PATIENT CAME IN OFFICE WANTING TO KNOW IF THERE IS ANOTHER SURGEON SHE CAN GET IN QUICKER WITH THAN JENKINS.  HE CAN NOT SEE HER UNTIL THE 11/27/14 AND HER PAIN IS GETTING WORSE   PLEASE CALL PATIENT [phone]

## 2014-11-06 ENCOUNTER — Other Ambulatory Visit: Payer: Self-pay

## 2014-11-06 DIAGNOSIS — R109 Unspecified abdominal pain: Secondary | ICD-10-CM

## 2014-11-06 NOTE — Telephone Encounter (Signed)
Called pt and LMOM to call office back  

## 2014-11-06 NOTE — Telephone Encounter (Signed)
Pt called office and I told her that I had spoken to Wynne DustEric Gill, NP and he states that if patient could be seen sooner by another surgery office other than [facility] that was fine.   [facility] [place]  Appt. 11/07/2014 @ 1145am.   All information has been faxed.

## 2014-11-06 NOTE — Telephone Encounter (Signed)
Noted, thanks for helping her get set up.

## 2014-12-09 DEATH — deceased

## 2015-04-23 ENCOUNTER — Encounter: Payer: Self-pay | Admitting: Cardiovascular Disease

## 2015-11-15 IMAGING — CT CT ABD-PELV W/O CM
2 of 4 series · 17 of 46 positions shown, 19 images · non-contrast
Comparison: None.

CLINICAL DATA: Acute Epigastric pain. EGD today. Polycystic kidney
disease.

EXAM:
CT ABDOMEN AND PELVIS WITHOUT CONTRAST
TECHNIQUE: Multidetector CT imaging of the abdomen and pelvis was performed
following the standard protocol without IV contrast.

[Series 3: abdomen/pelvis w/o contrast · axial · non-contrast · 0.76mm/px · z∈[-474,-94]mm · 14 of 84 slices shown, 16 images]
[im 4/84  soft-tissue]
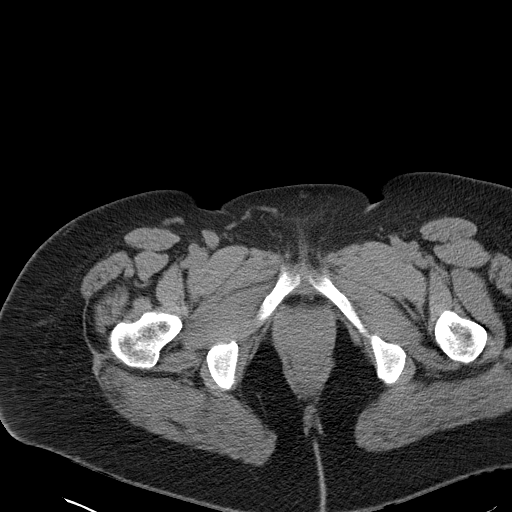
[im 4/84  bone]
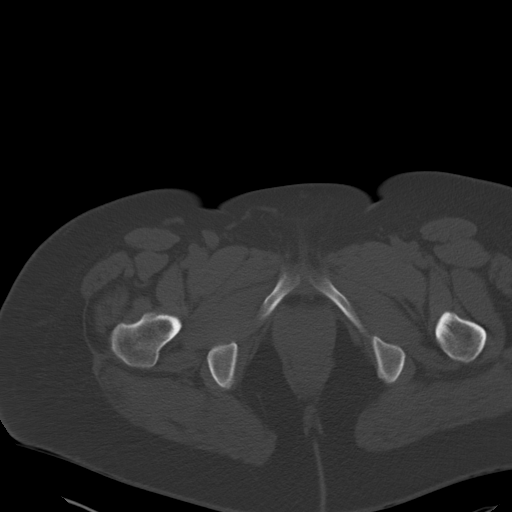
[im 11/84  soft-tissue]
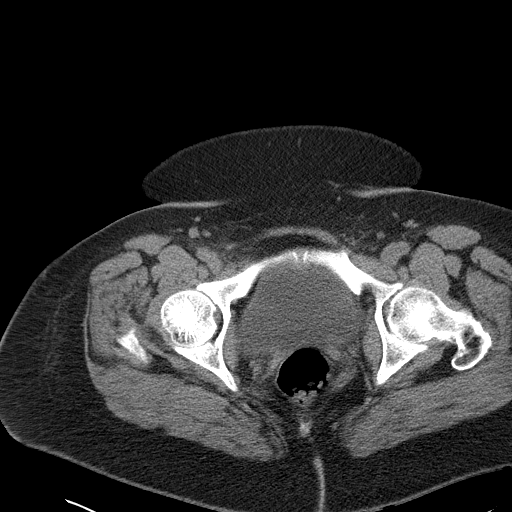
[im 15/84  soft-tissue]
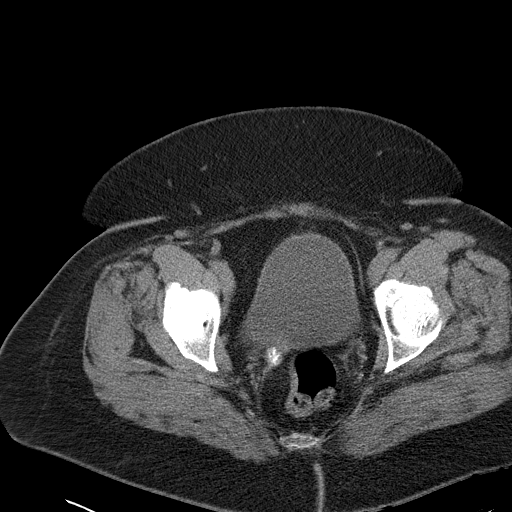
[im 22/84  soft-tissue]
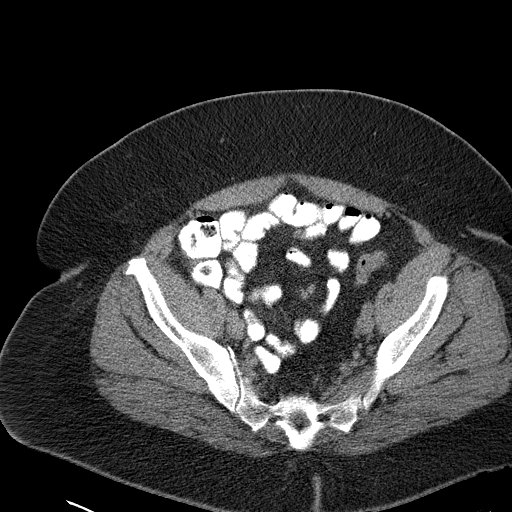
[im 29/84  soft-tissue]
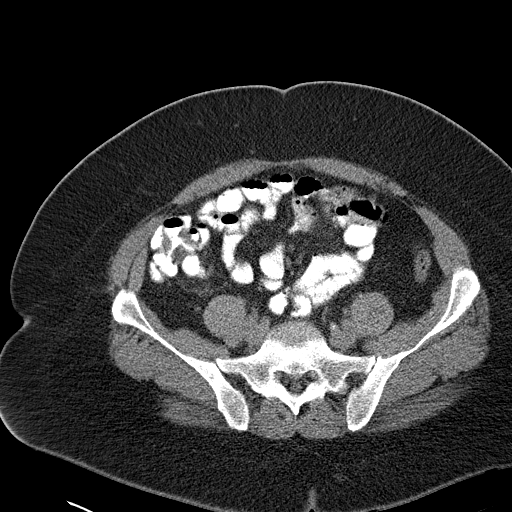
[im 33/84  soft-tissue]
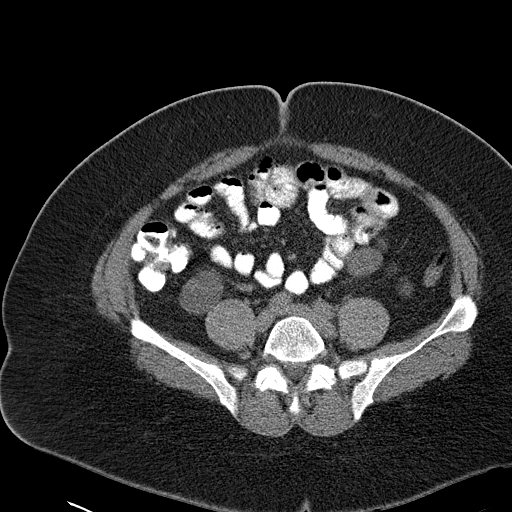
[im 40/84  soft-tissue]
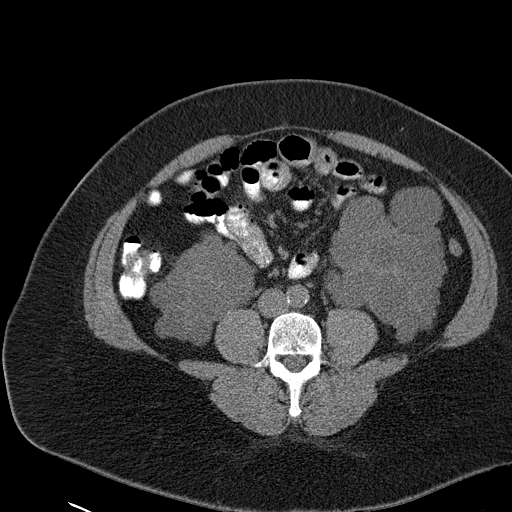
[im 44/84  soft-tissue]
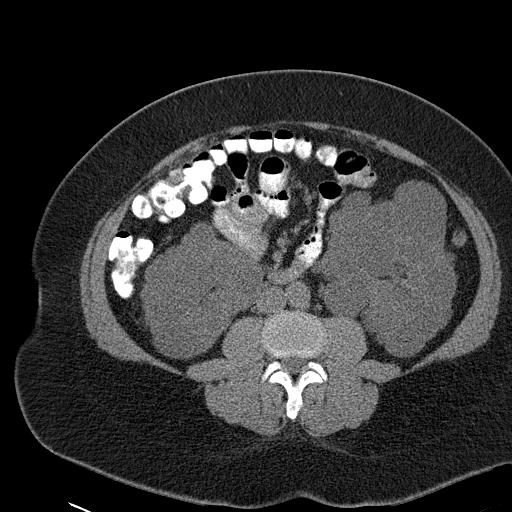
[im 51/84  soft-tissue]
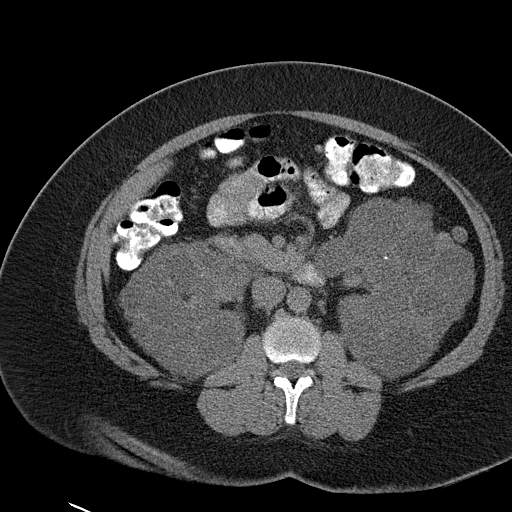
[im 51/84  bone]
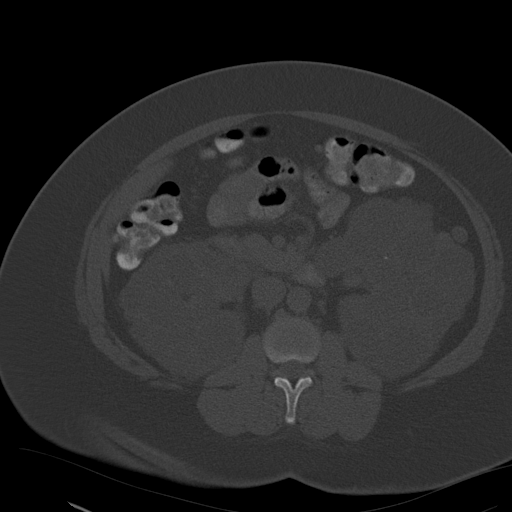
[im 55/84  soft-tissue]
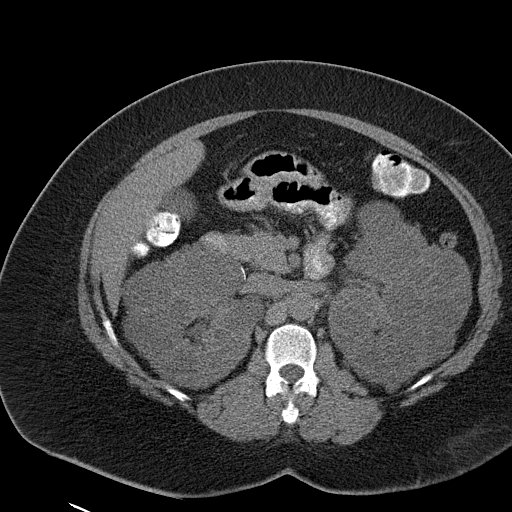
[im 62/84  soft-tissue]
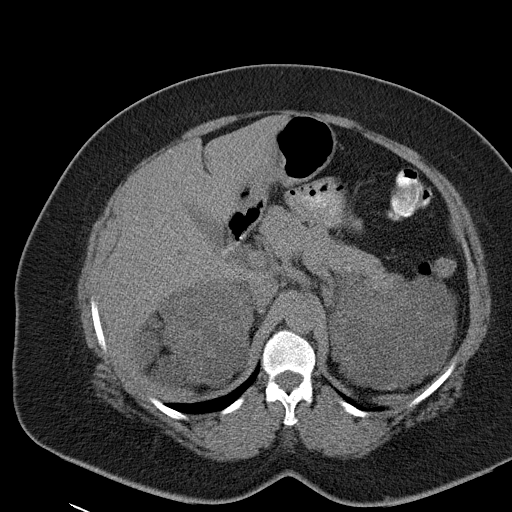
[im 69/84  soft-tissue]
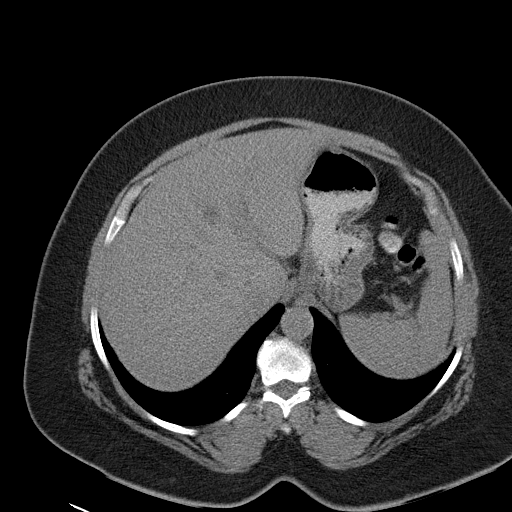
[im 73/84  soft-tissue]
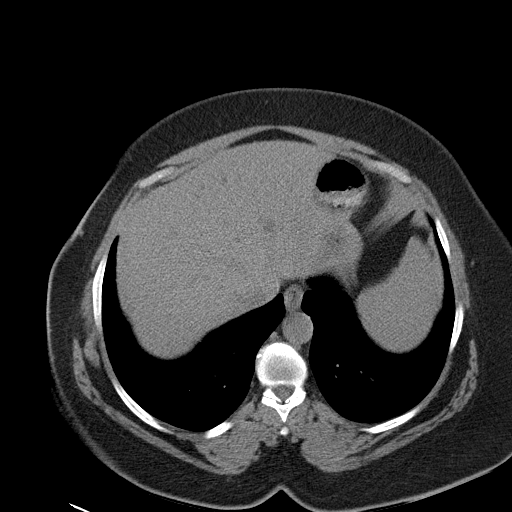
[im 80/84  soft-tissue]
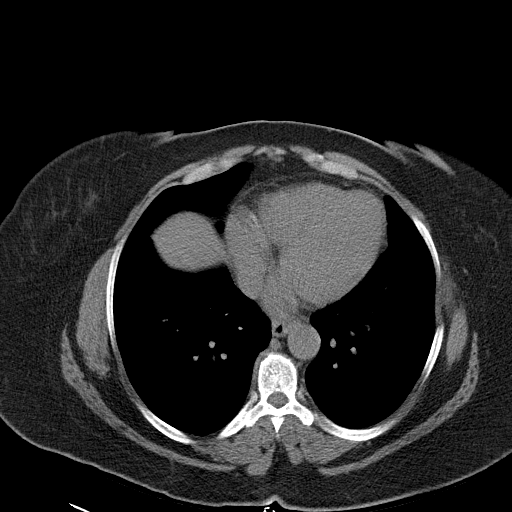

[Series 4: mpr cor 3.0mm · coronal · 0.79mm/px · 3 of 111 slices shown]
[im 37/111  soft-tissue]
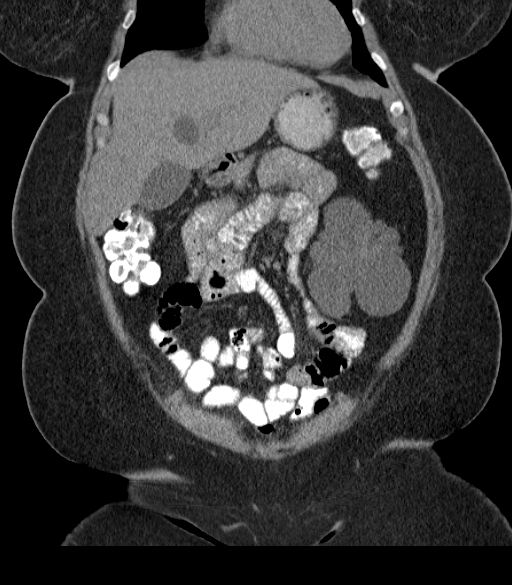
[im 49/111  soft-tissue]
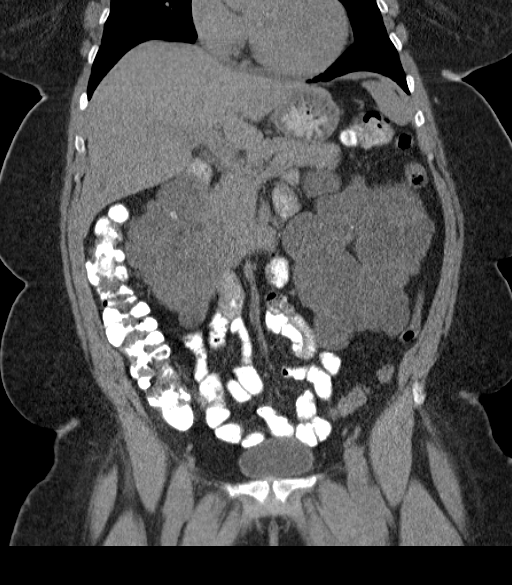
[im 62/111  soft-tissue]
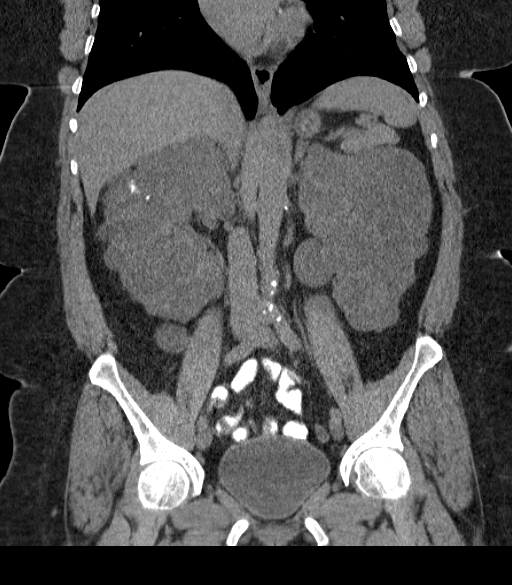

[17 of 46 positions shown; findings below may reference images not displayed]

FINDINGS: Lower chest: Lung bases are clear. No pleural fluid or pneumothorax.

Hepatobiliary: Non IV contrast several low-density cystic lesions
within the liver most consistent with benign cysts. There is a 6 mm
gallstone within the lumen of the gallbladder.

Pancreas: Pancreas is normal. No ductal dilatation. No pancreatic
inflammation.

Spleen: Normal spleen.

Adrenals/urinary tract: There are multiple low-density simple fluid
density renal cysts. Several cysts have curvilinear calcifications
associated with their walls. The kidneys are enlarged measuring
cm. No acute findings. Adrenal glands are normal.

Stomach/Bowel: The distal esophagus appears normal. No evidence of
extraluminal gas or fluid. The stomach, duodenum, small bowel, cecum
are normal. The colon and rectosigmoid colon are normal.

Vascular/Lymphatic: Abdominal aorta is normal caliber. No
retroperitoneal periportal lymphadenopathy.

Reproductive: Post hysterectomy anatomy.  No pelvic lymphadenopathy.

Musculoskeletal: No aggressive osseous lesion.
IMPRESSION: 1. No complication following EGD.
2. Polycystic kidney disease with enlarged kidneys. No acute
findings.
3. Cholelithiasis without cholecystitis.
